# Patient Record
Sex: Male | Born: 1950 | Race: Black or African American | Hispanic: No | Marital: Married | Smoking: Former smoker
Health system: Southern US, Community
[De-identification: ages and names within clinical notes are randomized; demographics above are authoritative.]

## PROBLEM LIST (undated history)

## (undated) DIAGNOSIS — D649 Anemia, unspecified: Secondary | ICD-10-CM

## (undated) DIAGNOSIS — M1990 Unspecified osteoarthritis, unspecified site: Secondary | ICD-10-CM

## (undated) DIAGNOSIS — I10 Essential (primary) hypertension: Secondary | ICD-10-CM

## (undated) DIAGNOSIS — R7611 Nonspecific reaction to tuberculin skin test without active tuberculosis: Secondary | ICD-10-CM

## (undated) HISTORY — PX: CIRCUMCISION: SUR203

## (undated) HISTORY — PX: TONSILLECTOMY: SUR1361

---

## 2014-06-14 DIAGNOSIS — Z87891 Personal history of nicotine dependence: Secondary | ICD-10-CM | POA: Insufficient documentation

## 2014-06-14 DIAGNOSIS — R9431 Abnormal electrocardiogram [ECG] [EKG]: Secondary | ICD-10-CM | POA: Insufficient documentation

## 2014-06-14 DIAGNOSIS — I10 Essential (primary) hypertension: Secondary | ICD-10-CM | POA: Insufficient documentation

## 2014-06-14 DIAGNOSIS — R002 Palpitations: Secondary | ICD-10-CM | POA: Insufficient documentation

## 2015-11-21 DIAGNOSIS — D649 Anemia, unspecified: Secondary | ICD-10-CM | POA: Insufficient documentation

## 2017-02-19 DIAGNOSIS — I10 Essential (primary) hypertension: Secondary | ICD-10-CM | POA: Diagnosis not present

## 2017-02-19 DIAGNOSIS — Z5181 Encounter for therapeutic drug level monitoring: Secondary | ICD-10-CM | POA: Diagnosis not present

## 2017-02-19 DIAGNOSIS — D508 Other iron deficiency anemias: Secondary | ICD-10-CM | POA: Diagnosis not present

## 2017-02-19 DIAGNOSIS — Z13220 Encounter for screening for lipoid disorders: Secondary | ICD-10-CM | POA: Diagnosis not present

## 2017-02-19 DIAGNOSIS — I00 Rheumatic fever without heart involvement: Secondary | ICD-10-CM | POA: Diagnosis not present

## 2017-03-13 DIAGNOSIS — M1612 Unilateral primary osteoarthritis, left hip: Secondary | ICD-10-CM | POA: Diagnosis not present

## 2017-03-13 DIAGNOSIS — Z79899 Other long term (current) drug therapy: Secondary | ICD-10-CM | POA: Diagnosis not present

## 2017-03-13 DIAGNOSIS — M069 Rheumatoid arthritis, unspecified: Secondary | ICD-10-CM | POA: Diagnosis not present

## 2017-03-13 DIAGNOSIS — M19041 Primary osteoarthritis, right hand: Secondary | ICD-10-CM | POA: Diagnosis not present

## 2017-03-13 DIAGNOSIS — M160 Bilateral primary osteoarthritis of hip: Secondary | ICD-10-CM | POA: Diagnosis not present

## 2017-03-13 DIAGNOSIS — M19031 Primary osteoarthritis, right wrist: Secondary | ICD-10-CM | POA: Diagnosis not present

## 2017-03-13 DIAGNOSIS — M19032 Primary osteoarthritis, left wrist: Secondary | ICD-10-CM | POA: Diagnosis not present

## 2017-03-13 DIAGNOSIS — M19042 Primary osteoarthritis, left hand: Secondary | ICD-10-CM | POA: Diagnosis not present

## 2017-03-21 DIAGNOSIS — M069 Rheumatoid arthritis, unspecified: Secondary | ICD-10-CM | POA: Diagnosis not present

## 2017-03-21 DIAGNOSIS — Z79899 Other long term (current) drug therapy: Secondary | ICD-10-CM | POA: Diagnosis not present

## 2017-05-02 DIAGNOSIS — M160 Bilateral primary osteoarthritis of hip: Secondary | ICD-10-CM | POA: Diagnosis not present

## 2017-06-11 DIAGNOSIS — D508 Other iron deficiency anemias: Secondary | ICD-10-CM | POA: Diagnosis not present

## 2017-06-11 DIAGNOSIS — M069 Rheumatoid arthritis, unspecified: Secondary | ICD-10-CM | POA: Diagnosis not present

## 2017-06-11 DIAGNOSIS — Z5181 Encounter for therapeutic drug level monitoring: Secondary | ICD-10-CM | POA: Diagnosis not present

## 2017-06-11 DIAGNOSIS — Z79899 Other long term (current) drug therapy: Secondary | ICD-10-CM | POA: Diagnosis not present

## 2017-06-11 DIAGNOSIS — Z13220 Encounter for screening for lipoid disorders: Secondary | ICD-10-CM | POA: Diagnosis not present

## 2017-06-18 DIAGNOSIS — Z0000 Encounter for general adult medical examination without abnormal findings: Secondary | ICD-10-CM | POA: Diagnosis not present

## 2017-06-18 DIAGNOSIS — D508 Other iron deficiency anemias: Secondary | ICD-10-CM | POA: Diagnosis not present

## 2017-06-18 DIAGNOSIS — M069 Rheumatoid arthritis, unspecified: Secondary | ICD-10-CM | POA: Diagnosis not present

## 2017-10-16 DIAGNOSIS — D508 Other iron deficiency anemias: Secondary | ICD-10-CM | POA: Diagnosis not present

## 2017-10-23 DIAGNOSIS — M0520 Rheumatoid vasculitis with rheumatoid arthritis of unspecified site: Secondary | ICD-10-CM | POA: Diagnosis not present

## 2017-10-23 DIAGNOSIS — D508 Other iron deficiency anemias: Secondary | ICD-10-CM | POA: Diagnosis not present

## 2017-10-23 DIAGNOSIS — Z13220 Encounter for screening for lipoid disorders: Secondary | ICD-10-CM | POA: Diagnosis not present

## 2017-10-23 DIAGNOSIS — I10 Essential (primary) hypertension: Secondary | ICD-10-CM | POA: Diagnosis not present

## 2017-10-23 DIAGNOSIS — L309 Dermatitis, unspecified: Secondary | ICD-10-CM | POA: Diagnosis not present

## 2018-02-13 DIAGNOSIS — R69 Illness, unspecified: Secondary | ICD-10-CM | POA: Diagnosis not present

## 2018-02-17 DIAGNOSIS — Z136 Encounter for screening for cardiovascular disorders: Secondary | ICD-10-CM | POA: Diagnosis not present

## 2018-02-17 DIAGNOSIS — I10 Essential (primary) hypertension: Secondary | ICD-10-CM | POA: Diagnosis not present

## 2018-02-24 DIAGNOSIS — I10 Essential (primary) hypertension: Secondary | ICD-10-CM | POA: Diagnosis not present

## 2018-02-24 DIAGNOSIS — R0789 Other chest pain: Secondary | ICD-10-CM | POA: Diagnosis not present

## 2018-02-24 DIAGNOSIS — Z0001 Encounter for general adult medical examination with abnormal findings: Secondary | ICD-10-CM | POA: Diagnosis not present

## 2018-02-24 DIAGNOSIS — M069 Rheumatoid arthritis, unspecified: Secondary | ICD-10-CM | POA: Diagnosis not present

## 2018-03-31 DIAGNOSIS — M160 Bilateral primary osteoarthritis of hip: Secondary | ICD-10-CM | POA: Diagnosis not present

## 2018-03-31 DIAGNOSIS — M0579 Rheumatoid arthritis with rheumatoid factor of multiple sites without organ or systems involvement: Secondary | ICD-10-CM | POA: Diagnosis not present

## 2018-03-31 DIAGNOSIS — Z79899 Other long term (current) drug therapy: Secondary | ICD-10-CM | POA: Diagnosis not present

## 2018-06-29 DIAGNOSIS — M160 Bilateral primary osteoarthritis of hip: Secondary | ICD-10-CM | POA: Diagnosis not present

## 2018-06-29 DIAGNOSIS — Z79899 Other long term (current) drug therapy: Secondary | ICD-10-CM | POA: Diagnosis not present

## 2018-06-29 DIAGNOSIS — M0579 Rheumatoid arthritis with rheumatoid factor of multiple sites without organ or systems involvement: Secondary | ICD-10-CM | POA: Diagnosis not present

## 2018-06-29 DIAGNOSIS — M25572 Pain in left ankle and joints of left foot: Secondary | ICD-10-CM | POA: Diagnosis not present

## 2018-07-17 DIAGNOSIS — M160 Bilateral primary osteoarthritis of hip: Secondary | ICD-10-CM | POA: Diagnosis not present

## 2018-07-17 DIAGNOSIS — Z79899 Other long term (current) drug therapy: Secondary | ICD-10-CM | POA: Diagnosis not present

## 2018-07-17 DIAGNOSIS — M0579 Rheumatoid arthritis with rheumatoid factor of multiple sites without organ or systems involvement: Secondary | ICD-10-CM | POA: Diagnosis not present

## 2018-07-28 DIAGNOSIS — R7612 Nonspecific reaction to cell mediated immunity measurement of gamma interferon antigen response without active tuberculosis: Secondary | ICD-10-CM | POA: Diagnosis not present

## 2018-07-28 DIAGNOSIS — A150 Tuberculosis of lung: Secondary | ICD-10-CM | POA: Diagnosis not present

## 2018-07-31 DIAGNOSIS — R7612 Nonspecific reaction to cell mediated immunity measurement of gamma interferon antigen response without active tuberculosis: Secondary | ICD-10-CM | POA: Insufficient documentation

## 2018-08-11 DIAGNOSIS — H5203 Hypermetropia, bilateral: Secondary | ICD-10-CM | POA: Diagnosis not present

## 2018-08-18 DIAGNOSIS — I10 Essential (primary) hypertension: Secondary | ICD-10-CM | POA: Diagnosis not present

## 2018-08-25 DIAGNOSIS — I10 Essential (primary) hypertension: Secondary | ICD-10-CM | POA: Diagnosis not present

## 2018-08-25 DIAGNOSIS — R350 Frequency of micturition: Secondary | ICD-10-CM | POA: Insufficient documentation

## 2018-08-25 DIAGNOSIS — M0579 Rheumatoid arthritis with rheumatoid factor of multiple sites without organ or systems involvement: Secondary | ICD-10-CM | POA: Diagnosis not present

## 2018-08-25 DIAGNOSIS — D508 Other iron deficiency anemias: Secondary | ICD-10-CM | POA: Diagnosis not present

## 2018-08-25 DIAGNOSIS — Z125 Encounter for screening for malignant neoplasm of prostate: Secondary | ICD-10-CM | POA: Diagnosis not present

## 2018-08-25 DIAGNOSIS — Z0000 Encounter for general adult medical examination without abnormal findings: Secondary | ICD-10-CM | POA: Diagnosis not present

## 2018-08-25 DIAGNOSIS — N401 Enlarged prostate with lower urinary tract symptoms: Secondary | ICD-10-CM | POA: Diagnosis not present

## 2018-09-07 ENCOUNTER — Telehealth: Payer: Self-pay

## 2018-09-24 NOTE — Telephone Encounter (Signed)
TC with patient.  Explained to patient that there was a recall on Rifapentine and that [facility] cannot start that medication at this time.  Patient will need to start Rifampin if he still wants to proceed with LTBI tx. Per patient would like to start LTBI tx. Scheduled appt for 09/29/18. Aileen Fass, RN

## 2018-09-28 ENCOUNTER — Other Ambulatory Visit: Payer: Self-pay

## 2018-09-28 DIAGNOSIS — R7612 Nonspecific reaction to cell mediated immunity measurement of gamma interferon antigen response without active tuberculosis: Secondary | ICD-10-CM

## 2018-09-28 MED ORDER — RIFAMPIN 300 MG PO CAPS: 600.0000 mg | ORAL_CAPSULE | Freq: Every day | ORAL | 3 refills | Status: DC

## 2018-09-28 NOTE — Progress Notes (Signed)
Tuberculosis treatment orders  All patients are to be monitored per [place] and county TB policies.   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 Treat for latent TB per the following:  Rifampin 600mg  daily by mouth x 4 months, draw LFTs monthly per Dr. Ernestina Patches. Aileen Fass, RN

## 2018-09-29 ENCOUNTER — Ambulatory Visit (LOCAL_COMMUNITY_HEALTH_CENTER): Payer: Self-pay

## 2018-09-29 ENCOUNTER — Other Ambulatory Visit: Payer: Self-pay

## 2018-09-29 VITALS — Wt 197.0 lb

## 2018-09-29 DIAGNOSIS — R7612 Nonspecific reaction to cell mediated immunity measurement of gamma interferon antigen response without active tuberculosis: Secondary | ICD-10-CM

## 2018-09-29 MED ORDER — RIFAMPIN 300 MG PO CAPS: 600.0000 mg | ORAL_CAPSULE | Freq: Every day | ORAL | 0 refills | Status: AC

## 2018-09-29 NOTE — Progress Notes (Signed)
Rifampin 300mg  #60 dispensed per Dr. Ernestina Patches 09/28/18 order. Aileen Fass, RN

## 2018-09-30 LAB — CBC WITH DIFFERENTIAL/PLATELET
Basophils Absolute: 0.1 10*3/uL (ref 0.0–0.2)
Basos: 1 %
EOS (ABSOLUTE): 0.4 10*3/uL (ref 0.0–0.4)
Eos: 6 %
Hematocrit: 39.4 % (ref 37.5–51.0)
Hemoglobin: 12.4 g/dL — ABNORMAL LOW (ref 13.0–17.7)
Immature Grans (Abs): 0 10*3/uL (ref 0.0–0.1)
Immature Granulocytes: 0 %
Lymphocytes Absolute: 1.8 10*3/uL (ref 0.7–3.1)
Lymphs: 23 %
MCH: 26.2 pg — ABNORMAL LOW (ref 26.6–33.0)
MCHC: 31.5 g/dL (ref 31.5–35.7)
MCV: 83 fL (ref 79–97)
Monocytes Absolute: 0.8 10*3/uL (ref 0.1–0.9)
Monocytes: 10 %
Neutrophils Absolute: 4.6 10*3/uL (ref 1.4–7.0)
Neutrophils: 60 %
Platelets: 124 10*3/uL — ABNORMAL LOW (ref 150–450)
RBC: 4.74 x10E6/uL (ref 4.14–5.80)
RDW: 12.7 % (ref 11.6–15.4)
WBC: 7.6 10*3/uL (ref 3.4–10.8)

## 2018-09-30 LAB — HEPATIC FUNCTION PANEL
ALT: 13 IU/L (ref 0–44)
AST: 22 IU/L (ref 0–40)
Albumin: 4.4 g/dL (ref 3.8–4.8)
Alkaline Phosphatase: 61 IU/L (ref 39–117)
Bilirubin Total: 0.5 mg/dL (ref 0.0–1.2)
Bilirubin, Direct: 0.17 mg/dL (ref 0.00–0.40)
Total Protein: 6.9 g/dL (ref 6.0–8.5)

## 2018-10-27 ENCOUNTER — Other Ambulatory Visit: Payer: Self-pay

## 2018-10-27 ENCOUNTER — Ambulatory Visit (LOCAL_COMMUNITY_HEALTH_CENTER): Payer: Self-pay

## 2018-10-27 VITALS — Wt 198.0 lb

## 2018-10-27 DIAGNOSIS — R7612 Nonspecific reaction to cell mediated immunity measurement of gamma interferon antigen response without active tuberculosis: Secondary | ICD-10-CM

## 2018-10-27 MED ORDER — RIFAMPIN 300 MG PO CAPS: 600.0000 mg | ORAL_CAPSULE | Freq: Every day | ORAL | 0 refills | Status: AC

## 2018-10-27 NOTE — Progress Notes (Signed)
Pt states he has not started any new medicines since his last visit to [facility] and is only taking the Rifampin he gets at [facility]. Pt denies any problems or changes since starting Rifampin. Pt sent to lab for monthly LFTs. 2nd bottle of #60 Rifampin 300 mg dispensed to pt, 2 tablets po qd, per Dr Lauretta Chester order from 09/28/2018. Scheduled next TB med appt for 11/24/2018.

## 2018-10-28 LAB — HEPATIC FUNCTION PANEL
ALT: 8 IU/L (ref 0–44)
AST: 14 IU/L (ref 0–40)
Albumin: 4.1 g/dL (ref 3.8–4.8)
Alkaline Phosphatase: 66 IU/L (ref 39–117)
Bilirubin Total: 0.6 mg/dL (ref 0.0–1.2)
Bilirubin, Direct: 0.26 mg/dL (ref 0.00–0.40)
Total Protein: 6.6 g/dL (ref 6.0–8.5)

## 2018-10-30 NOTE — Progress Notes (Signed)
Curator for clinical support staff: I agree with the care provided to this patient and was available for any consultation.   Caren Macadam, MD, MPH, ABFM [facility] Medical Director

## 2018-11-02 NOTE — Progress Notes (Signed)
Curator for clinical support staff: I agree with dosing of this latent TB treatment and have reviewed the chart.    Caren Macadam, MD, MPH, ABFM [facility] Medical Director

## 2018-11-24 ENCOUNTER — Ambulatory Visit (LOCAL_COMMUNITY_HEALTH_CENTER): Payer: Self-pay

## 2018-11-24 ENCOUNTER — Other Ambulatory Visit: Payer: Self-pay

## 2018-11-24 VITALS — Wt 196.0 lb

## 2018-11-24 DIAGNOSIS — R7612 Nonspecific reaction to cell mediated immunity measurement of gamma interferon antigen response without active tuberculosis: Secondary | ICD-10-CM

## 2018-11-24 MED ORDER — RIFAMPIN 300 MG PO CAPS: 600.0000 mg | ORAL_CAPSULE | Freq: Every day | ORAL | 0 refills | Status: AC

## 2018-11-24 NOTE — Progress Notes (Signed)
Pt to clinic for 3rd bottle of Rifampin. Pt denies starting any new medications and denies problems and symptoms associated with Rifampin. Pt sent to lab for LFT. Dispensed #60 Rifampin 300 mg tablets, 2 tabs po qd, to pt per Dr. Joelene Millin [place]'s order dated 09/28/2018. Pt instructed to bring 3rd bottle with remaining Rifampin with him to his appt on 12/22/2018.

## 2018-11-25 LAB — HEPATIC FUNCTION PANEL
ALT: 5 IU/L (ref 0–44)
AST: 13 IU/L (ref 0–40)
Albumin: 4.2 g/dL (ref 3.8–4.8)
Alkaline Phosphatase: 74 IU/L (ref 39–117)
Bilirubin Total: 0.6 mg/dL (ref 0.0–1.2)
Bilirubin, Direct: 0.27 mg/dL (ref 0.00–0.40)
Total Protein: 7 g/dL (ref 6.0–8.5)

## 2018-12-22 ENCOUNTER — Ambulatory Visit (LOCAL_COMMUNITY_HEALTH_CENTER): Payer: Self-pay

## 2018-12-22 ENCOUNTER — Other Ambulatory Visit: Payer: Self-pay

## 2018-12-22 VITALS — Wt 195.0 lb

## 2018-12-22 DIAGNOSIS — R7612 Nonspecific reaction to cell mediated immunity measurement of gamma interferon antigen response without active tuberculosis: Secondary | ICD-10-CM

## 2018-12-22 MED ORDER — RIFAMPIN 300 MG PO CAPS: 600.0000 mg | ORAL_CAPSULE | Freq: Every day | ORAL | 0 refills | Status: AC

## 2018-12-22 NOTE — Progress Notes (Signed)
Pt to clinic for 4th bottle of Rifampin. Pt has 6 days Alipio of Rifampin left in 3rd bottle and has already taken his TB medication today. Pt denies starting any new medications and denies problems and symptoms associated with Rifampin. Pt sent to lab for LFT. Dispensed #60 Rifampin 300 mg tablets, 2 tabs po qd, to pt per Dr. Joelene Millin [place]'s order dated 09/28/2018. Completion letter and card reviewed with and provided to pt. ROI signed for [facility] to share TB documents with PCP. TB medication completion document faxed to [facility].

## 2018-12-23 LAB — HEPATIC FUNCTION PANEL
ALT: 5 IU/L (ref 0–44)
AST: 15 IU/L (ref 0–40)
Albumin: 4.1 g/dL (ref 3.8–4.8)
Alkaline Phosphatase: 73 IU/L (ref 39–117)
Bilirubin Total: 0.7 mg/dL (ref 0.0–1.2)
Bilirubin, Direct: 0.33 mg/dL (ref 0.00–0.40)
Total Protein: 6.8 g/dL (ref 6.0–8.5)

## 2019-01-04 ENCOUNTER — Other Ambulatory Visit: Payer: Self-pay

## 2019-01-04 DIAGNOSIS — Z20822 Contact with and (suspected) exposure to covid-19: Secondary | ICD-10-CM

## 2019-01-06 LAB — NOVEL CORONAVIRUS, NAA: SARS-CoV-2, NAA: NOT DETECTED

## 2019-03-10 DIAGNOSIS — I10 Essential (primary) hypertension: Secondary | ICD-10-CM | POA: Diagnosis not present

## 2019-03-10 DIAGNOSIS — Z13220 Encounter for screening for lipoid disorders: Secondary | ICD-10-CM | POA: Diagnosis not present

## 2019-03-10 DIAGNOSIS — Z125 Encounter for screening for malignant neoplasm of prostate: Secondary | ICD-10-CM | POA: Diagnosis not present

## 2019-03-17 DIAGNOSIS — Z23 Encounter for immunization: Secondary | ICD-10-CM | POA: Diagnosis not present

## 2019-03-17 DIAGNOSIS — N401 Enlarged prostate with lower urinary tract symptoms: Secondary | ICD-10-CM | POA: Diagnosis not present

## 2019-03-17 DIAGNOSIS — R350 Frequency of micturition: Secondary | ICD-10-CM | POA: Diagnosis not present

## 2019-03-17 DIAGNOSIS — K219 Gastro-esophageal reflux disease without esophagitis: Secondary | ICD-10-CM | POA: Diagnosis not present

## 2019-03-17 DIAGNOSIS — R079 Chest pain, unspecified: Secondary | ICD-10-CM | POA: Diagnosis not present

## 2019-03-17 DIAGNOSIS — Z0000 Encounter for general adult medical examination without abnormal findings: Secondary | ICD-10-CM | POA: Diagnosis not present

## 2019-03-17 DIAGNOSIS — M0579 Rheumatoid arthritis with rheumatoid factor of multiple sites without organ or systems involvement: Secondary | ICD-10-CM | POA: Diagnosis not present

## 2019-03-17 DIAGNOSIS — R0789 Other chest pain: Secondary | ICD-10-CM | POA: Diagnosis not present

## 2019-03-17 DIAGNOSIS — Z7983 Long term (current) use of bisphosphonates: Secondary | ICD-10-CM | POA: Diagnosis not present

## 2019-06-15 DIAGNOSIS — M47812 Spondylosis without myelopathy or radiculopathy, cervical region: Secondary | ICD-10-CM | POA: Diagnosis not present

## 2019-06-15 DIAGNOSIS — M160 Bilateral primary osteoarthritis of hip: Secondary | ICD-10-CM | POA: Diagnosis not present

## 2019-06-15 DIAGNOSIS — M542 Cervicalgia: Secondary | ICD-10-CM | POA: Diagnosis not present

## 2019-06-15 DIAGNOSIS — Z227 Latent tuberculosis: Secondary | ICD-10-CM | POA: Diagnosis not present

## 2019-06-15 DIAGNOSIS — R937 Abnormal findings on diagnostic imaging of other parts of musculoskeletal system: Secondary | ICD-10-CM | POA: Diagnosis not present

## 2019-06-15 DIAGNOSIS — M0579 Rheumatoid arthritis with rheumatoid factor of multiple sites without organ or systems involvement: Secondary | ICD-10-CM | POA: Diagnosis not present

## 2019-06-15 DIAGNOSIS — Z79899 Other long term (current) drug therapy: Secondary | ICD-10-CM | POA: Diagnosis not present

## 2019-06-16 ENCOUNTER — Other Ambulatory Visit: Payer: Self-pay | Admitting: Internal Medicine

## 2019-06-16 DIAGNOSIS — M4302 Spondylolysis, cervical region: Secondary | ICD-10-CM

## 2019-06-24 DIAGNOSIS — M25552 Pain in left hip: Secondary | ICD-10-CM | POA: Diagnosis not present

## 2019-06-24 DIAGNOSIS — M1612 Unilateral primary osteoarthritis, left hip: Secondary | ICD-10-CM | POA: Diagnosis not present

## 2019-06-30 ENCOUNTER — Ambulatory Visit
Admission: RE | Admit: 2019-06-30 | Discharge: 2019-06-30 | Disposition: A | Discharge status: Home / Self Care | Payer: Medicare HMO | Source: Ambulatory Visit | Attending: Internal Medicine | Admitting: Internal Medicine

## 2019-06-30 ENCOUNTER — Other Ambulatory Visit: Payer: Self-pay

## 2019-06-30 DIAGNOSIS — M4302 Spondylolysis, cervical region: Secondary | ICD-10-CM | POA: Diagnosis not present

## 2019-06-30 DIAGNOSIS — M4802 Spinal stenosis, cervical region: Secondary | ICD-10-CM | POA: Diagnosis not present

## 2019-06-30 DIAGNOSIS — M542 Cervicalgia: Secondary | ICD-10-CM | POA: Diagnosis not present

## 2019-07-13 DIAGNOSIS — Z79899 Other long term (current) drug therapy: Secondary | ICD-10-CM | POA: Diagnosis not present

## 2019-07-13 DIAGNOSIS — M160 Bilateral primary osteoarthritis of hip: Secondary | ICD-10-CM | POA: Diagnosis not present

## 2019-07-13 DIAGNOSIS — M0579 Rheumatoid arthritis with rheumatoid factor of multiple sites without organ or systems involvement: Secondary | ICD-10-CM | POA: Diagnosis not present

## 2019-07-13 DIAGNOSIS — Z227 Latent tuberculosis: Secondary | ICD-10-CM | POA: Diagnosis not present

## 2019-07-16 DIAGNOSIS — M5030 Other cervical disc degeneration, unspecified cervical region: Secondary | ICD-10-CM | POA: Diagnosis not present

## 2019-07-16 DIAGNOSIS — M4802 Spinal stenosis, cervical region: Secondary | ICD-10-CM | POA: Diagnosis not present

## 2019-07-16 DIAGNOSIS — M47812 Spondylosis without myelopathy or radiculopathy, cervical region: Secondary | ICD-10-CM | POA: Diagnosis not present

## 2019-08-11 DIAGNOSIS — H5203 Hypermetropia, bilateral: Secondary | ICD-10-CM | POA: Diagnosis not present

## 2019-08-31 DIAGNOSIS — M1612 Unilateral primary osteoarthritis, left hip: Secondary | ICD-10-CM | POA: Diagnosis not present

## 2019-09-04 DIAGNOSIS — M1612 Unilateral primary osteoarthritis, left hip: Secondary | ICD-10-CM | POA: Insufficient documentation

## 2019-10-19 DIAGNOSIS — Z79899 Other long term (current) drug therapy: Secondary | ICD-10-CM | POA: Diagnosis not present

## 2019-10-19 DIAGNOSIS — M0579 Rheumatoid arthritis with rheumatoid factor of multiple sites without organ or systems involvement: Secondary | ICD-10-CM | POA: Diagnosis not present

## 2019-11-01 ENCOUNTER — Encounter
Admission: RE | Admit: 2019-11-01 | Discharge: 2019-11-01 | Disposition: A | Discharge status: Home / Self Care | Payer: Medicare HMO | Source: Ambulatory Visit | Attending: Orthopedic Surgery | Admitting: Orthopedic Surgery

## 2019-11-01 ENCOUNTER — Other Ambulatory Visit: Payer: Self-pay

## 2019-11-01 DIAGNOSIS — Z01818 Encounter for other preprocedural examination: Secondary | ICD-10-CM | POA: Insufficient documentation

## 2019-11-01 HISTORY — DX: Nonspecific reaction to tuberculin skin test without active tuberculosis: R76.11

## 2019-11-01 HISTORY — DX: Unspecified osteoarthritis, unspecified site: M19.90

## 2019-11-01 HISTORY — DX: Anemia, unspecified: D64.9

## 2019-11-01 HISTORY — DX: Essential (primary) hypertension: I10

## 2019-11-01 LAB — APTT: aPTT: 28 seconds (ref 24–36)

## 2019-11-01 LAB — PROTIME-INR
INR: 1.1 (ref 0.8–1.2)
Prothrombin Time: 13.8 seconds (ref 11.4–15.2)

## 2019-11-01 LAB — URINALYSIS, ROUTINE W REFLEX MICROSCOPIC
Bilirubin Urine: NEGATIVE
Glucose, UA: NEGATIVE mg/dL
Hgb urine dipstick: NEGATIVE
Ketones, ur: NEGATIVE mg/dL
Leukocytes,Ua: NEGATIVE
Nitrite: NEGATIVE
Protein, ur: NEGATIVE mg/dL
Specific Gravity, Urine: 1.018 (ref 1.005–1.030)
pH: 5 (ref 5.0–8.0)

## 2019-11-01 LAB — SURGICAL PCR SCREEN
MRSA, PCR: NEGATIVE
Staphylococcus aureus: NEGATIVE

## 2019-11-01 LAB — SEDIMENTATION RATE: Sed Rate: 58 mm/hr — ABNORMAL HIGH (ref 0–20)

## 2019-11-01 LAB — COMPREHENSIVE METABOLIC PANEL
ALT: 9 U/L (ref 0–44)
AST: 16 U/L (ref 15–41)
Albumin: 4 g/dL (ref 3.5–5.0)
Alkaline Phosphatase: 48 U/L (ref 38–126)
Anion gap: 9 (ref 5–15)
BUN: 17 mg/dL (ref 8–23)
CO2: 28 mmol/L (ref 22–32)
Calcium: 8.8 mg/dL — ABNORMAL LOW (ref 8.9–10.3)
Chloride: 102 mmol/L (ref 98–111)
Creatinine, Ser: 1.1 mg/dL (ref 0.61–1.24)
GFR calc Af Amer: >60 mL/min (ref >60)
GFR calc non Af Amer: >60 mL/min (ref >60)
Glucose, Bld: 86 mg/dL (ref 70–99)
Potassium: 3.5 mmol/L (ref 3.5–5.1)
Sodium: 139 mmol/L (ref 135–145)
Total Bilirubin: 0.9 mg/dL (ref 0.3–1.2)
Total Protein: 7.7 g/dL (ref 6.5–8.1)

## 2019-11-01 LAB — C-REACTIVE PROTEIN: CRP: 2.3 mg/dL — ABNORMAL HIGH (ref <1.0)

## 2019-11-01 LAB — TYPE AND SCREEN
ABO/RH(D): B POS
Antibody Screen: NEGATIVE

## 2019-11-01 NOTE — Patient Instructions (Signed)
Your procedure is scheduled on:Mon. 9/27 Report to Day Surgery. To find out your arrival time please call [phone] between 1PM - 3PM on Friday 9/24.  Remember: Instructions that are not followed completely may result in serious medical risk,  up to and including death, or upon the discretion of your surgeon and anesthesiologist your  surgery may need to be rescheduled.     _X__ 1. Do not eat food after midnight the night before your procedure.                 No chewing gum or hard candies. You may drink clear liquids up to 2 hours                 before you are scheduled to arrive for your surgery- DO not drink clear                 liquids within 2 hours of the start of your surgery.                 Clear Liquids include:  water, apple juice without pulp, clear Gatorade, G2 or                  Gatorade Zero (avoid Red/Purple/Blue), Black Coffee or Tea (Do not add                 anything to coffee or tea). __x___2.   Complete the "Ensure Clear Pre-surgery Clear Carbohydrate Drink" provided to you, 2 hours before arrival.   __X__2.  On the morning of surgery brush your teeth with toothpaste and water, you                may rinse your mouth with mouthwash if you wish.  Do not swallow any toothpaste of mouthwash.     _X__ 3.  No Alcohol for 24 hours before or after surgery.   ___ 4.  Do Not Smoke or use e-cigarettes For 24 Hours Prior to Your Surgery.                 Do not use any chewable tobacco products for at least 6 hours prior to                 Surgery.  ___  5.  Do not use any recreational drugs (marijuana, cocaine, heroin, ecstasy, MDMA or other)                For at least one week prior to your surgery.  Combination of these drugs with anesthesia                May have life threatening results.  ____  6.  Bring all medications with you on the day of surgery if instructed.   __x__  7.  Notify your doctor if there is any change in your medical  condition      (cold, fever, infections).     Do not wear jewelry, Do not wear lotions,  You may wear deodorant. Do not shave 48 hours prior to surgery. Men may shave face and neck. Do not bring valuables to the hospital.    [facility] is not responsible for any belongings or valuables.  Contacts, dentures or bridgework may not be worn into surgery. Leave your suitcase in the car. After surgery it may be brought to your room. For patients admitted to the hospital, discharge time is determined by your treatment team.   Patients  discharged the day of surgery will not be allowed to drive home.   Make arrangements for someone to be with you for the first 24 hours of your Same Day Discharge.    Please read over the following fact sheets that you were given:   Incentive Spirometer   _x___ Take these medicines the morning of surgery with A SIP OF WATER:    1. none  2.   3.   4.  5.  6.  ____ Fleet Enema (as directed)   __x__ Use CHG Soap (or wipes) as directed  ____ Use Benzoyl Peroxide Gel as instructed  ____ Use inhalers on the day of surgery  ____ Stop metformin 2 days prior to surgery    ____ Take 1/2 of usual insulin dose the night before surgery. No insulin the morning          of surgery.   __x__ Stop aspirin today  __x__ Stop Anti-inflammatories today   Ibuprofen aleve or aspirin products   May take tylenol.  (Stop Arava as per Rheumatologist instructions)   __x__ Stop supplements until after surgery . Ascorbic Acid (VITAMIN C) 1000 MG tablet   ____ Bring C-Pap to the hospital.    If you have any questions regarding your pre-procedure instructions,  Please call Pre-admit Testing at [phone] [facility]

## 2019-11-02 LAB — URINE CULTURE
Culture: NO GROWTH
Special Requests: NORMAL

## 2019-11-04 ENCOUNTER — Other Ambulatory Visit: Payer: Self-pay

## 2019-11-04 ENCOUNTER — Other Ambulatory Visit
Admission: RE | Admit: 2019-11-04 | Discharge: 2019-11-04 | Disposition: A | Discharge status: Home / Self Care | Payer: Medicare HMO | Source: Ambulatory Visit | Attending: Orthopedic Surgery | Admitting: Orthopedic Surgery

## 2019-11-04 DIAGNOSIS — Z20822 Contact with and (suspected) exposure to covid-19: Secondary | ICD-10-CM | POA: Diagnosis not present

## 2019-11-04 DIAGNOSIS — Z01812 Encounter for preprocedural laboratory examination: Secondary | ICD-10-CM | POA: Insufficient documentation

## 2019-11-04 LAB — IGE: IgE (Immunoglobulin E), Serum: 413 IU/mL (ref 6–495)

## 2019-11-04 LAB — SARS CORONAVIRUS 2 (TAT 6-24 HRS): SARS Coronavirus 2: NEGATIVE

## 2019-11-07 ENCOUNTER — Encounter: Payer: Self-pay | Admitting: Orthopedic Surgery

## 2019-11-07 NOTE — H&P (Signed)
ORTHOPAEDIC HISTORY & PHYSICAL Progress Notes  Michelene Gardener, [place] - 11/02/2019 11:30 AM EDT [facility] Chief Complaint:   Chief Complaint  Patient presents with  . Hip Pain  H & P LEFT HIP   History of Present Illness:   Erik Peck is a 69 y.o. male that presents to clinic today for his preoperative history and evaluation. Patient presents unaccompanied. The patient is scheduled to undergo a left total hip arthroplasty on 11/08/19 by Dr. Ernest Pine. His pain began around 3 years ago. The pain is located in he left hip and groin. He describes his pain as worse with weightbearing and movement of the hip.He denies associated numbness or tingling.   The patient's symptoms have progressed to the point that they decrease his quality of life. The patient has previously undergone conservative treatment including NSAIDS, intra-articular cortisone hip injections and activity modification without adequate control of his symptoms.  Patient denies significant cardiac history, denies history of blood clots, denies previous lumbar surgery.  Patient does report a penicillin allergy. Patient has a history of rheumatoid arthritis and is currently treated with prednisone and leflunomide.  Past Medical, Surgical, Family, Social History, Allergies, Medications:   Past Medical History:  Past Medical History:  Diagnosis Date  . Hypertension  . Rheumatoid arthritis(714.0) (CMS-HCC)   Past Surgical History: History reviewed. No pertinent surgical history.  Current Medications:  Current Outpatient Medications  Medication Sig Dispense Refill  . acetaminophen (TYLENOL) 650 MG ER tablet Take 1,300 mg by mouth every 8 (eight) hours as needed for Pain  . ascorbic acid, vitamin C, (VITAMIN C) 1000 MG tablet Take 500 mg by mouth once daily  . aspirin 81 MG EC tablet Take 81 mg by mouth once daily  . ergocalciferol, vitamin D2, 1,250 mcg (50,000 unit) capsule  TAKE 1 CAPSULE EVERY 14 DAYS 6 capsule 0  . FERATE 240 mg (27 mg iron) tablet Take 27 mg by mouth 3 (three) times a day 0  . hydrocortisone 1 % cream Apply topically 2 (two) times daily  . leflunomide (ARAVA) 20 MG tablet 1 tab QD, 90 days 90 tablet 4  . lisinopriL-hydrochlorothiazide (ZESTORETIC) 20-12.5 mg tablet TAKE 1 TABLET EVERY DAY 90 tablet 1   No current facility-administered medications for this visit.   Allergies:  Allergies  Allergen Reactions  . Penicillins Swelling and Rash      Social History:  Social History   Socioeconomic History  . Marital status: Married  Spouse name: Talbert Forest  . Number of children: 0  . Years of education: 10  . Highest education level: Not on file  Occupational History  . 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 Smoking status: Never Smoker  . Smokeless tobacco: Never Used  Vaping Use  . Vaping Use: Never used  Substance and Sexual Activity  . Alcohol use: No  . Drug use: No  . Sexual activity: Not on file  Other Topics Concern  . Not on file  Social History Narrative  . Not on file   Social Determinants of Health   Financial Resource Strain:  . Difficulty of Paying Living Expenses:  Food Insecurity:  . Worried About Programme researcher, broadcasting/film/video in the Last Year:  . Barista in the Last Year:  Transportation Needs:  . Freight forwarder (Medical):  Marland Kitchen Lack of Transportation (Non-Medical):  Physical Activity:  . Days of Exercise per Week:  . Minutes of Exercise per  Session:  Stress:  . Feeling of Stress :  Social Connections:  . Frequency of Communication with Friends and Family:  . Frequency of Social Gatherings with Friends and Family:  . Attends Religious Services:  . Active Member of Clubs or Organizations:  . Attends Banker Meetings:  Marland Kitchen Marital Status:   Family History: History reviewed. No pertinent family history.  Review of Systems:   A 10+ ROS was performed, reviewed, and the  pertinent orthopaedic findings are documented in the HPI.   Physical Examination:   BP 140/80  Ht 175.3 cm (5\' 9" )  Wt 90.3 kg (199 lb)  BMI 29.39 kg/m   Patient is a well-developed, well-nourished male in no acute distress. Patient has normal mood and affect. Patient is alert and oriented to person, place, and time.   HEENT: Atraumatic, normocephalic. Pupils equal and reactive to light. Extraocular motion intact. Noninjected sclera.  Cardiovascular: Regular rate and rhythm, with no murmurs, rubs, or gallops. Distal pulses palpable.  Respiratory: Lungs clear to auscultation bilaterally.    Left Hip: Pelvic tilt: Negative Limb lengths: Equal with the patient standing Soft tissue swelling: Negative Erythema: Negative Crepitance: Negative Tenderness: Greater trochanter is nontender to palpation. Moderate pain is elicited by axial compression or extremes of rotation. Atrophy: No atrophy. Good hip flexor and abductor strength. Range of Motion: EXT/FLEX: 0/0/100 ADD/ABD: 20/0/20 IR/ER: 10/0/10  Sensation is intact over the saphenous, lateral cutaneous, superficial fibular, and deep fibular nerve distributions.  Tests Performed/Reviewed:  X-rays  No new radiographs were obtained today. Previous radiographs were reviewed of the left hip and revealed complete loss of femoral acetabular joint space with bone-on-bone contact. Cystic changes of the femoral head noted. Osteophyte formation of the femoral head noted. No fractures or dislocations.  Impression:   ICD-10-CM  1. Primary osteoarthritis of left hip M16.12   Plan:   The patient has end-stage degenerative changes of the left hip. It was explained to the patient that the condition is progressive in nature. Having failed conservative treatment, the patient has elected to proceed with a total joint arthroplasty. The patient will undergo a total joint arthroplasty with Dr. 11-29-2004. The risks of surgery, including blood clot and  infection, were discussed with the patient. Measures to reduce these risks, including the use of anticoagulation, perioperative antibiotics, and early ambulation were discussed. The importance of postoperative physical therapy was discussed with the patient. The patient elects to proceed with surgery. The patient is instructed to stop all blood thinners prior to surgery. The patient is instructed to call the hospital the day before surgery to learn of the proper arrival time.   Contact our office with any questions or concerns. Follow up as indicated, or sooner should any new problems arise, if conditions worsen, or if they are otherwise concerned.   Ernest Pine, PA-C [facility]opa[facility] Medicine [address] [place], [place] Phone: [phone]  This note was generated in part with voice recognition software and I apologize for any typographical errors that were not detected and corrected.   Electronically signed by [phone], PA at 11/02/2019 6:32 PM EDT

## 2019-11-08 ENCOUNTER — Other Ambulatory Visit: Payer: Self-pay

## 2019-11-08 ENCOUNTER — Ambulatory Visit: Payer: Medicare HMO

## 2019-11-08 ENCOUNTER — Ambulatory Visit: Payer: Medicare HMO | Admitting: Urgent Care

## 2019-11-08 ENCOUNTER — Encounter: Payer: Self-pay | Admitting: Orthopedic Surgery

## 2019-11-08 ENCOUNTER — Encounter
Admission: RE | Disposition: A | Discharge status: Home / Self Care | Payer: Self-pay | Source: Home / Self Care | Attending: Orthopedic Surgery

## 2019-11-08 ENCOUNTER — Ambulatory Visit
Admission: RE | Admit: 2019-11-08 | Discharge: 2019-11-08 | Disposition: A | Discharge status: Home / Self Care | Payer: Medicare HMO | Attending: Orthopedic Surgery | Admitting: Orthopedic Surgery

## 2019-11-08 DIAGNOSIS — Z87891 Personal history of nicotine dependence: Secondary | ICD-10-CM | POA: Diagnosis not present

## 2019-11-08 DIAGNOSIS — Z7982 Long term (current) use of aspirin: Secondary | ICD-10-CM | POA: Diagnosis not present

## 2019-11-08 DIAGNOSIS — M069 Rheumatoid arthritis, unspecified: Secondary | ICD-10-CM | POA: Insufficient documentation

## 2019-11-08 DIAGNOSIS — Z96649 Presence of unspecified artificial hip joint: Secondary | ICD-10-CM

## 2019-11-08 DIAGNOSIS — Z791 Long term (current) use of non-steroidal anti-inflammatories (NSAID): Secondary | ICD-10-CM | POA: Insufficient documentation

## 2019-11-08 DIAGNOSIS — M0689 Other specified rheumatoid arthritis, multiple sites: Secondary | ICD-10-CM | POA: Diagnosis not present

## 2019-11-08 DIAGNOSIS — M1612 Unilateral primary osteoarthritis, left hip: Secondary | ICD-10-CM | POA: Diagnosis not present

## 2019-11-08 DIAGNOSIS — Z471 Aftercare following joint replacement surgery: Secondary | ICD-10-CM | POA: Diagnosis not present

## 2019-11-08 DIAGNOSIS — I10 Essential (primary) hypertension: Secondary | ICD-10-CM | POA: Insufficient documentation

## 2019-11-08 DIAGNOSIS — Z79899 Other long term (current) drug therapy: Secondary | ICD-10-CM | POA: Insufficient documentation

## 2019-11-08 DIAGNOSIS — Z96642 Presence of left artificial hip joint: Secondary | ICD-10-CM

## 2019-11-08 DIAGNOSIS — D509 Iron deficiency anemia, unspecified: Secondary | ICD-10-CM | POA: Diagnosis not present

## 2019-11-08 HISTORY — PX: TOTAL HIP ARTHROPLASTY: SHX124

## 2019-11-08 LAB — ABO/RH: ABO/RH(D): B POS

## 2019-11-08 SURGERY — ARTHROPLASTY, HIP, TOTAL,POSTERIOR APPROACH
Anesthesia: Spinal | Site: Hip | Laterality: Left

## 2019-11-08 MED ORDER — CELECOXIB 200 MG PO CAPS
ORAL_CAPSULE | ORAL | Status: AC
  Administered 2019-11-08: 400 mg via ORAL
  Filled 2019-11-08: qty 2

## 2019-11-08 MED ORDER — TRANEXAMIC ACID-NACL 1000-0.7 MG/100ML-% IV SOLN
INTRAVENOUS | Status: AC
  Administered 2019-11-08: 1000 mg via INTRAVENOUS
  Filled 2019-11-08: qty 100

## 2019-11-08 MED ORDER — GABAPENTIN 300 MG PO CAPS
ORAL_CAPSULE | ORAL | Status: AC
  Administered 2019-11-08: 300 mg via ORAL
  Filled 2019-11-08: qty 1

## 2019-11-08 MED ORDER — PROPOFOL 10 MG/ML IV BOLUS
INTRAVENOUS | Status: DC | PRN
  Administered 2019-11-08: 20 mg via INTRAVENOUS

## 2019-11-08 MED ORDER — SURGIRINSE WOUND IRRIGATION SYSTEM - OPTIME
TOPICAL | Status: DC | PRN
  Administered 2019-11-08: 900 mL via TOPICAL

## 2019-11-08 MED ORDER — OXYCODONE HCL 5 MG PO TABS: 5.0000 mg | ORAL_TABLET | Freq: Once | ORAL | Status: DC | PRN

## 2019-11-08 MED ORDER — BUPIVACAINE HCL (PF) 0.5 % IJ SOLN
INTRAMUSCULAR | Status: DC | PRN
  Administered 2019-11-08: 3 mL via INTRATHECAL

## 2019-11-08 MED ORDER — ENOXAPARIN SODIUM 40 MG/0.4ML ~~LOC~~ SOLN: 40.0000 mg | SUBCUTANEOUS | Status: DC

## 2019-11-08 MED ORDER — OXYCODONE HCL 5 MG PO TABS: 5.0000 mg | ORAL_TABLET | ORAL | Status: DC | PRN

## 2019-11-08 MED ORDER — PANTOPRAZOLE SODIUM 40 MG PO TBEC
40.0000 mg | DELAYED_RELEASE_TABLET | Freq: Two times a day (BID) | ORAL | Status: DC
  Filled 2019-11-08: qty 1

## 2019-11-08 MED ORDER — CHLORHEXIDINE GLUCONATE 4 % EX LIQD
60.0000 mL | Freq: Once | CUTANEOUS | Status: AC
  Administered 2019-11-08: 4 via TOPICAL

## 2019-11-08 MED ORDER — MENTHOL 3 MG MT LOZG
1.0000 | LOZENGE | OROMUCOSAL | Status: DC | PRN
  Filled 2019-11-08: qty 9

## 2019-11-08 MED ORDER — CEFAZOLIN SODIUM-DEXTROSE 2-4 GM/100ML-% IV SOLN
2.0000 g | Freq: Four times a day (QID) | INTRAVENOUS | Status: AC
  Administered 2019-11-08: 2 g via INTRAVENOUS

## 2019-11-08 MED ORDER — MIDAZOLAM HCL 2 MG/2ML IJ SOLN
INTRAMUSCULAR | Status: AC
  Filled 2019-11-08: qty 2

## 2019-11-08 MED ORDER — BISACODYL 10 MG RE SUPP
10.0000 mg | Freq: Every day | RECTAL | Status: DC | PRN
  Filled 2019-11-08: qty 1

## 2019-11-08 MED ORDER — SENNOSIDES-DOCUSATE SODIUM 8.6-50 MG PO TABS
1.0000 | ORAL_TABLET | Freq: Two times a day (BID) | ORAL | Status: DC
  Filled 2019-11-08: qty 1

## 2019-11-08 MED ORDER — FENTANYL CITRATE (PF) 100 MCG/2ML IJ SOLN: 25.0000 ug | INTRAMUSCULAR | Status: DC | PRN

## 2019-11-08 MED ORDER — BUPIVACAINE HCL (PF) 0.5 % IJ SOLN
INTRAMUSCULAR | Status: AC
  Filled 2019-11-08: qty 10

## 2019-11-08 MED ORDER — ACETAMINOPHEN 10 MG/ML IV SOLN
INTRAVENOUS | Status: AC
  Filled 2019-11-08: qty 100

## 2019-11-08 MED ORDER — ACETAMINOPHEN 10 MG/ML IV SOLN: 1000.0000 mg | Freq: Four times a day (QID) | INTRAVENOUS | Status: DC

## 2019-11-08 MED ORDER — GABAPENTIN 300 MG PO CAPS: 300.0000 mg | ORAL_CAPSULE | Freq: Once | ORAL | Status: AC

## 2019-11-08 MED ORDER — TRANEXAMIC ACID-NACL 1000-0.7 MG/100ML-% IV SOLN: 1000.0000 mg | Freq: Once | INTRAVENOUS | Status: AC

## 2019-11-08 MED ORDER — LACTATED RINGERS IV BOLUS
250.0000 mL | Freq: Once | INTRAVENOUS | Status: AC
  Administered 2019-11-08: 250 mL via INTRAVENOUS

## 2019-11-08 MED ORDER — TRAMADOL HCL 50 MG PO TABS: 50.0000 mg | ORAL_TABLET | ORAL | Status: DC | PRN

## 2019-11-08 MED ORDER — CEFAZOLIN SODIUM-DEXTROSE 2-4 GM/100ML-% IV SOLN
2.0000 g | INTRAVENOUS | Status: AC
  Administered 2019-11-08: 2 g via INTRAVENOUS

## 2019-11-08 MED ORDER — ENSURE PRE-SURGERY PO LIQD
296.0000 mL | Freq: Once | ORAL | Status: AC
  Administered 2019-11-08: 296 mL via ORAL
  Filled 2019-11-08: qty 296

## 2019-11-08 MED ORDER — FLEET ENEMA 7-19 GM/118ML RE ENEM: 1.0000 | ENEMA | Freq: Once | RECTAL | Status: DC | PRN

## 2019-11-08 MED ORDER — HYDROMORPHONE HCL 1 MG/ML IJ SOLN: 0.5000 mg | INTRAMUSCULAR | Status: DC | PRN

## 2019-11-08 MED ORDER — PHENOL 1.4 % MT LIQD
1.0000 | OROMUCOSAL | Status: DC | PRN
  Filled 2019-11-08: qty 177

## 2019-11-08 MED ORDER — ACETAMINOPHEN 10 MG/ML IV SOLN
INTRAVENOUS | Status: DC | PRN
  Administered 2019-11-08: 1000 mg via INTRAVENOUS

## 2019-11-08 MED ORDER — CEFAZOLIN SODIUM-DEXTROSE 2-4 GM/100ML-% IV SOLN
INTRAVENOUS | Status: AC
  Filled 2019-11-08: qty 100

## 2019-11-08 MED ORDER — PHENYLEPHRINE HCL (PRESSORS) 10 MG/ML IV SOLN
INTRAVENOUS | Status: AC
  Filled 2019-11-08: qty 1

## 2019-11-08 MED ORDER — PROPOFOL 500 MG/50ML IV EMUL
INTRAVENOUS | Status: AC
  Filled 2019-11-08: qty 50

## 2019-11-08 MED ORDER — MIDAZOLAM HCL 5 MG/5ML IJ SOLN
INTRAMUSCULAR | Status: DC | PRN
  Administered 2019-11-08: 2 mg via INTRAVENOUS

## 2019-11-08 MED ORDER — PROPOFOL 500 MG/50ML IV EMUL
INTRAVENOUS | Status: DC | PRN
  Administered 2019-11-08: 35 ug/kg/min via INTRAVENOUS

## 2019-11-08 MED ORDER — ONDANSETRON HCL 4 MG/2ML IJ SOLN
4.0000 mg | Freq: Four times a day (QID) | INTRAMUSCULAR | Status: DC | PRN
Start: 2019-11-08 — End: 2019-11-08

## 2019-11-08 MED ORDER — LACTATED RINGERS IV BOLUS
500.0000 mL | Freq: Once | INTRAVENOUS | Status: AC
Start: 2019-11-08 — End: 2019-11-08
  Administered 2019-11-08: 500 mL via INTRAVENOUS

## 2019-11-08 MED ORDER — TRANEXAMIC ACID-NACL 1000-0.7 MG/100ML-% IV SOLN
1000.0000 mg | INTRAVENOUS | Status: AC
  Administered 2019-11-08: 1000 mg via INTRAVENOUS

## 2019-11-08 MED ORDER — CHLORHEXIDINE GLUCONATE 0.12 % MT SOLN: 15.0000 mL | Freq: Once | OROMUCOSAL | Status: AC

## 2019-11-08 MED ORDER — ACETAMINOPHEN 325 MG PO TABS: 325.0000 mg | ORAL_TABLET | Freq: Four times a day (QID) | ORAL | Status: DC | PRN

## 2019-11-08 MED ORDER — TRANEXAMIC ACID-NACL 1000-0.7 MG/100ML-% IV SOLN
INTRAVENOUS | Status: AC
  Filled 2019-11-08: qty 100

## 2019-11-08 MED ORDER — GLYCOPYRROLATE 0.2 MG/ML IJ SOLN
INTRAMUSCULAR | Status: DC | PRN
  Administered 2019-11-08 (×2): .1 mg via INTRAVENOUS

## 2019-11-08 MED ORDER — LACTATED RINGERS IV BOLUS: 250.0000 mL | Freq: Once | INTRAVENOUS | Status: DC

## 2019-11-08 MED ORDER — FERROUS SULFATE 325 (65 FE) MG PO TABS
325.0000 mg | ORAL_TABLET | Freq: Two times a day (BID) | ORAL | Status: DC
  Filled 2019-11-08: qty 1

## 2019-11-08 MED ORDER — GLYCOPYRROLATE 0.2 MG/ML IJ SOLN
INTRAMUSCULAR | Status: AC
  Filled 2019-11-08: qty 1

## 2019-11-08 MED ORDER — ALUM & MAG HYDROXIDE-SIMETH 200-200-20 MG/5ML PO SUSP: 30.0000 mL | ORAL | Status: DC | PRN

## 2019-11-08 MED ORDER — ACETAMINOPHEN 10 MG/ML IV SOLN
INTRAVENOUS | Status: AC
  Administered 2019-11-08: 1000 mg
  Filled 2019-11-08: qty 100

## 2019-11-08 MED ORDER — CELECOXIB 200 MG PO CAPS: 400.0000 mg | ORAL_CAPSULE | Freq: Once | ORAL | Status: AC

## 2019-11-08 MED ORDER — OXYCODONE HCL 5 MG PO TABS: 5.0000 mg | ORAL_TABLET | ORAL | 0 refills | Status: DC | PRN

## 2019-11-08 MED ORDER — ONDANSETRON HCL 4 MG PO TABS: 4.0000 mg | ORAL_TABLET | Freq: Four times a day (QID) | ORAL | Status: DC | PRN

## 2019-11-08 MED ORDER — DIPHENHYDRAMINE HCL 12.5 MG/5ML PO ELIX
12.5000 mg | ORAL_SOLUTION | ORAL | Status: DC | PRN
  Filled 2019-11-08: qty 10

## 2019-11-08 MED ORDER — TRAMADOL HCL 50 MG PO TABS: 50.0000 mg | ORAL_TABLET | Freq: Four times a day (QID) | ORAL | 0 refills | Status: DC | PRN

## 2019-11-08 MED ORDER — CELECOXIB 200 MG PO CAPS: 200.0000 mg | ORAL_CAPSULE | Freq: Two times a day (BID) | ORAL | 2 refills | Status: AC

## 2019-11-08 MED ORDER — FENTANYL CITRATE (PF) 100 MCG/2ML IJ SOLN
INTRAMUSCULAR | Status: DC | PRN
Start: 2019-11-08 — End: 2019-11-08
  Administered 2019-11-08 (×2): 50 ug via INTRAVENOUS

## 2019-11-08 MED ORDER — SODIUM CHLORIDE 0.9 % IV SOLN
INTRAVENOUS | Status: DC | PRN
  Administered 2019-11-08: 25 ug/min via INTRAVENOUS

## 2019-11-08 MED ORDER — FENTANYL CITRATE (PF) 100 MCG/2ML IJ SOLN
INTRAMUSCULAR | Status: AC
  Filled 2019-11-08: qty 2

## 2019-11-08 MED ORDER — METOCLOPRAMIDE HCL 10 MG PO TABS: 10.0000 mg | ORAL_TABLET | Freq: Three times a day (TID) | ORAL | Status: DC

## 2019-11-08 MED ORDER — SODIUM CHLORIDE 0.9 % IV SOLN: INTRAVENOUS | Status: DC

## 2019-11-08 MED ORDER — NEOMYCIN-POLYMYXIN B GU 40-200000 IR SOLN
Status: DC | PRN
  Administered 2019-11-08: 16 mL

## 2019-11-08 MED ORDER — CHLORHEXIDINE GLUCONATE 0.12 % MT SOLN
OROMUCOSAL | Status: AC
  Administered 2019-11-08: 15 mL via OROMUCOSAL
  Filled 2019-11-08: qty 15

## 2019-11-08 MED ORDER — MAGNESIUM HYDROXIDE 400 MG/5ML PO SUSP
30.0000 mL | Freq: Every day | ORAL | Status: DC
  Filled 2019-11-08: qty 30

## 2019-11-08 MED ORDER — OXYCODONE HCL 5 MG PO TABS: 10.0000 mg | ORAL_TABLET | ORAL | Status: DC | PRN

## 2019-11-08 MED ORDER — ORAL CARE MOUTH RINSE: 15.0000 mL | Freq: Once | OROMUCOSAL | Status: AC

## 2019-11-08 MED ORDER — DEXAMETHASONE SODIUM PHOSPHATE 10 MG/ML IJ SOLN
INTRAMUSCULAR | Status: AC
  Administered 2019-11-08: 8 mg via INTRAVENOUS
  Filled 2019-11-08: qty 1

## 2019-11-08 MED ORDER — DEXAMETHASONE SODIUM PHOSPHATE 10 MG/ML IJ SOLN: 8.0000 mg | Freq: Once | INTRAMUSCULAR | Status: AC

## 2019-11-08 MED ORDER — LACTATED RINGERS IV SOLN: INTRAVENOUS | Status: DC

## 2019-11-08 MED ORDER — ENOXAPARIN SODIUM 40 MG/0.4ML ~~LOC~~ SOLN: 40.0000 mg | SUBCUTANEOUS | 0 refills | Status: DC

## 2019-11-08 MED ORDER — FAMOTIDINE 20 MG PO TABS: 20.0000 mg | ORAL_TABLET | Freq: Once | ORAL | Status: AC

## 2019-11-08 MED ORDER — FAMOTIDINE 20 MG PO TABS
ORAL_TABLET | ORAL | Status: AC
  Administered 2019-11-08: 20 mg via ORAL
  Filled 2019-11-08: qty 1

## 2019-11-08 MED ORDER — OXYCODONE HCL 5 MG/5ML PO SOLN: 5.0000 mg | Freq: Once | ORAL | Status: DC | PRN

## 2019-11-08 SURGICAL SUPPLY — 67 items
ARTICULEZE HEAD (Hips) ×2 IMPLANT
BLADE DRUM FLTD (BLADE) ×2 IMPLANT
BLADE SAW 90X25X1.19 OSCILLAT (BLADE) ×2 IMPLANT
CANISTER PREVENA 45 (CANNISTER) ×1 IMPLANT
CANISTER SUCT 1200ML W/VALVE (MISCELLANEOUS) ×2 IMPLANT
CANISTER SUCT 3000ML PPV (MISCELLANEOUS) ×4 IMPLANT
CARTRIDGE OIL MAESTRO DRILL (MISCELLANEOUS) ×1 IMPLANT
COVER WAND RF STERILE (DRAPES) ×2 IMPLANT
CUP PINNACLE 100 SERIES 58MM (Hips) ×1 IMPLANT
DIFFUSER DRILL AIR PNEUMATIC (MISCELLANEOUS) ×2 IMPLANT
DRAPE 3/4 80X56 (DRAPES) ×2 IMPLANT
DRAPE INCISE IOBAN 66X60 STRL (DRAPES) ×2 IMPLANT
DRSG DERMACEA 8X12 NADH (GAUZE/BANDAGES/DRESSINGS) ×2 IMPLANT
DRSG MEPILEX SACRM 8.7X9.8 (GAUZE/BANDAGES/DRESSINGS) ×2 IMPLANT
DRSG OPSITE POSTOP 4X12 (GAUZE/BANDAGES/DRESSINGS) ×1 IMPLANT
DRSG OPSITE POSTOP 4X14 (GAUZE/BANDAGES/DRESSINGS) IMPLANT
DRSG TEGADERM 4X4.75 (GAUZE/BANDAGES/DRESSINGS) ×2 IMPLANT
DURAPREP 26ML APPLICATOR (WOUND CARE) ×2 IMPLANT
ELECT REM PT RETURN 9FT ADLT (ELECTROSURGICAL) ×2
ELECTRODE REM PT RTRN 9FT ADLT (ELECTROSURGICAL) ×1 IMPLANT
GLOVE BIO SURGEON STRL SZ7.5 (GLOVE) ×4 IMPLANT
GLOVE BIOGEL M STRL SZ7.5 (GLOVE) ×4 IMPLANT
GLOVE BIOGEL PI IND STRL 7.5 (GLOVE) ×1 IMPLANT
GLOVE BIOGEL PI INDICATOR 7.5 (GLOVE) ×1
GLOVE INDICATOR 8.0 STRL GRN (GLOVE) ×2 IMPLANT
GOWN STRL REUS W/ TWL LRG LVL3 (GOWN DISPOSABLE) ×2 IMPLANT
GOWN STRL REUS W/ TWL XL LVL3 (GOWN DISPOSABLE) ×1 IMPLANT
GOWN STRL REUS W/TWL LRG LVL3 (GOWN DISPOSABLE) ×4
GOWN STRL REUS W/TWL XL LVL3 (GOWN DISPOSABLE) ×2
HEAD ARTICULEZE (Hips) IMPLANT
HEMOVAC 400CC 10FR (MISCELLANEOUS) ×1 IMPLANT
HOLDER FOLEY CATH W/STRAP (MISCELLANEOUS) ×2 IMPLANT
HOOD PEEL AWAY FLYTE STAYCOOL (MISCELLANEOUS) ×4 IMPLANT
IRRIGATION SURGIPHOR STRL (IV SOLUTION) ×2 IMPLANT
KIT PREVENA INCISION MGT20CM45 (CANNISTER) ×1 IMPLANT
KIT TURNOVER KIT A (KITS) ×2 IMPLANT
LINER MARATHON 10 DEG 36MM (Hips) ×2 IMPLANT
LINER MARATHON 10D 36MM (Hips) IMPLANT
MANIFOLD NEPTUNE II (INSTRUMENTS) ×2 IMPLANT
NDL SAFETY ECLIPSE 18X1.5 (NEEDLE) ×1 IMPLANT
NEEDLE HYPO 18GX1.5 SHARP (NEEDLE) ×2
NS IRRIG 500ML POUR BTL (IV SOLUTION) ×2 IMPLANT
OIL CARTRIDGE MAESTRO DRILL (MISCELLANEOUS) ×2
PACK HIP PROSTHESIS (MISCELLANEOUS) ×2 IMPLANT
PENCIL SMOKE ULTRAEVAC 22 CON (MISCELLANEOUS) ×2 IMPLANT
PIN STEIN THRED 5/32 (Pin) ×2 IMPLANT
PULSAVAC PLUS IRRIG FAN TIP (DISPOSABLE) ×2
SOL .9 NS 3000ML IRR  AL (IV SOLUTION) ×1
SOL .9 NS 3000ML IRR AL (IV SOLUTION) ×1
SOL .9 NS 3000ML IRR UROMATIC (IV SOLUTION) ×1 IMPLANT
SOL PREP PVP 2OZ (MISCELLANEOUS) ×2
SOLUTION PREP PVP 2OZ (MISCELLANEOUS) ×1 IMPLANT
SPONGE DRAIN TRACH 4X4 STRL 2S (GAUZE/BANDAGES/DRESSINGS) ×1 IMPLANT
STAPLER SKIN PROX 35W (STAPLE) ×2 IMPLANT
STEM FEM CMNTLSS SM AML 15.0 (Hips) ×1 IMPLANT
SUT ETHIBOND #5 BRAIDED 30INL (SUTURE) ×2 IMPLANT
SUT VIC AB 0 CT1 36 (SUTURE) ×2 IMPLANT
SUT VIC AB 1 CT1 36 (SUTURE) ×4 IMPLANT
SUT VIC AB 2-0 CT1 27 (SUTURE) ×2
SUT VIC AB 2-0 CT1 TAPERPNT 27 (SUTURE) ×1 IMPLANT
SYR 20ML LL LF (SYRINGE) ×2 IMPLANT
TAPE CLOTH 3X10 WHT NS LF (GAUZE/BANDAGES/DRESSINGS) ×2 IMPLANT
TAPE TRANSPORE STRL 2 31045 (GAUZE/BANDAGES/DRESSINGS) ×2 IMPLANT
TIP FAN IRRIG PULSAVAC PLUS (DISPOSABLE) ×1 IMPLANT
TOWEL OR 17X26 4PK STRL BLUE (TOWEL DISPOSABLE) ×2 IMPLANT
TRAY FOLEY MTR SLVR 16FR STAT (SET/KITS/TRAYS/PACK) ×2 IMPLANT
TUBING CONNECTING 10 (TUBING) ×1 IMPLANT

## 2019-11-08 NOTE — Op Note (Signed)
OPERATIVE NOTE  DATE OF SURGERY:  11/08/2019  PATIENT NAME:  Erik Peck   DOB: 12-04-1950  MRN: [id number]  PRE-OPERATIVE DIAGNOSIS: Degenerative arthrosis of the left hip, primary  POST-OPERATIVE DIAGNOSIS:  Same  PROCEDURE:  Left total hip arthroplasty  SURGEON:  Jena Gauss. M.D.  ASSISTANT: Baldwin Jamaica, PA-C (present and scrubbed throughout the case, critical for assistance with exposure, retraction, instrumentation, and closure)  ANESTHESIA: spinal  ESTIMATED BLOOD LOSS: 50 mL  FLUIDS REPLACED: 1400 mL of crystalloid  DRAINS: Wound VAC  IMPLANTS UTILIZED: DePuy 15 mm small stature AML femoral stem, 58 mm OD Pinnacle 100 acetabular component, neutral Pinnacle Marathon polyethylene insert, and a 36 mm M-SPEC +5 mm hip ball  INDICATIONS FOR SURGERY: EZRIEL BOFFA is a 69 y.o. year old male with a history of rheumatoid arthritis and progressive hip and groin  pain. X-rays demonstrated severe degenerative changes. The patient had not seen any significant improvement despite conservative nonsurgical intervention. After discussion of the risks and benefits of surgical intervention, the patient expressed understanding of the risks benefits and agree with plans for total hip arthroplasty.   The risks, benefits, and alternatives were discussed at length including but not limited to the risks of infection, bleeding, nerve injury, stiffness, blood clots, the need for revision surgery, limb length inequality, dislocation, cardiopulmonary complications, among others, and they were willing to proceed.  PROCEDURE IN DETAIL: The patient was brought into the operating room and, after adequate spinal anesthesia was achieved, the patient was placed in a right lateral decubitus position. Axillary roll was placed and all bony prominences were well-padded. The patient's left hip was cleaned and prepped with alcohol and DuraPrep and draped in the usual sterile fashion. A "timeout" was performed  as per usual protocol. A lateral curvilinear incision was made gently curving towards the posterior superior iliac spine. The IT band was incised in line with the skin incision and the fibers of the gluteus maximus were split in line. The piriformis tendon was identified, skeletonized, and incised at its insertion to the proximal femur and reflected posteriorly. A T type posterior capsulotomy was performed. Prior to dislocation of the femoral head, a threaded Steinmann pin was inserted through a separate stab incision into the pelvis superior to the acetabulum and bent in the form of a stylus so as to assess limb length and hip offset throughout the procedure. The femoral head was then dislocated posteriorly. Inspection of the femoral head demonstrated severe degenerative changes with full-thickness loss of articular cartilage. The femoral neck cut was performed using an oscillating saw. The anterior capsule was elevated off of the femoral neck using a periosteal elevator. Attention was then directed to the acetabulum. The remnant of the labrum was excised using electrocautery. Inspection of the acetabulum also demonstrated significant degenerative changes. The acetabulum was reamed in sequential fashion up to a 57 mm diameter. Good punctate bleeding bone was encountered. A 58 mm Pinnacle 100 acetabular component was positioned and impacted into place. Good scratch fit was appreciated. A neutral polyethylene trial was inserted.  Attention was then directed to the proximal femur. A hole for reaming of the proximal femoral canal was created using a high-speed burr. The femoral canal was reamed in sequential fashion up to a 14.5 mm diameter. This allowed for approximately 5 cm of scratch fit with extremely tight fit distally.  It was thus elected to ream up to a 50 mm diameter to allow for a line to line fit.. Serial  broaches were inserted up to a 15 mm ball stature femoral broach. Calcar region was planed and a  trial reduction was performed using a 36 mm hip ball with a +5 mm neck length. Good equalization of limb lengths and hip offset was appreciated and excellent stability was noted both anteriorly and posteriorly. Trial components were removed. The acetabular shell was irrigated with copious amounts of normal saline with antibiotic solution and suctioned dry. A neutral Pinnacle Marathon polyethylene insert was positioned and impacted into place. Next, a 15 mm small stature AML femoral stem was positioned and impacted into place. Excellent scratch fit was appreciated. A trial reduction was again performed with a 36 mm hip ball with a +5 mm neck length. Again, good equalization of limb lengths was appreciated and excellent stability appreciated both anteriorly and posteriorly. The hip was then dislocated and the trial hip ball was removed. The Morse taper was cleaned and dried. A 36 mm M-SPEC hip ball with a +5 mm neck length was placed on the trunnion and impacted into place. The hip was then reduced and placed through range of motion. Excellent stability was appreciated both anteriorly and posteriorly.  The wound was irrigated with copious amounts of normal saline followed by 500 ml of Surgiphor and suctioned dry. Good hemostasis was appreciated. The posterior capsulotomy was repaired using #5 Ethibond. Piriformis tendon was reapproximated to the undersurface of the gluteus medius tendon using #5 Ethibond. The IT band was reapproximated using interrupted sutures of #1 Vicryl. Subcutaneous tissue was approximated using first #0 Vicryl followed by #2-0 Vicryl. The skin was closed with skin staples.  The patient tolerated the procedure well and was transported to the recovery room in stable condition.   Jena Gauss., M.D.

## 2019-11-08 NOTE — Anesthesia Postprocedure Evaluation (Signed)
Anesthesia Post Note  Patient: Erik Peck  Procedure(s) Performed: TOTAL HIP ARTHROPLASTY (Left Hip)  Patient location during evaluation: PACU Anesthesia Type: Spinal Level of consciousness: oriented and awake and alert Pain management: pain level controlled Vital Signs Assessment: post-procedure vital signs reviewed and stable Respiratory status: spontaneous breathing and respiratory function stable Cardiovascular status: blood pressure returned to baseline and stable Postop Assessment: no headache, no backache, no apparent nausea or vomiting and patient able to bend at knees Anesthetic complications: no   No complications documented.   Last Vitals:  Vitals:   11/08/19 1157 11/08/19 1216  BP: (!) 164/102   Pulse: 66 (!) 58  Resp: 13 16  Temp: (!) 36.2 C (!) 36.1 C  SpO2: 99% 99%    Last Pain:  Vitals:   11/08/19 1216  TempSrc: Temporal  PainSc: 0-No pain                 Cleda Mccreedy [place]

## 2019-11-08 NOTE — OR Nursing (Signed)
OT in for evaluation @ 1551.

## 2019-11-08 NOTE — Discharge Instructions (Addendum)
Instructions after Total Hip Replacement     James P. Hooten, Jr., M.D.     Dept. of Orthopaedics & Sports Medicine  [facility]  [address]  [place], [place]  [address]  Phone: [phone]   Fax: [phone]    DIET: . Drink plenty of non-alcoholic fluids. . Resume your normal diet. Include foods high in fiber.  ACTIVITY:  . You may use crutches or a walker with weight-bearing as tolerated, unless instructed otherwise. . You may be weaned off of the walker or crutches by your Physical Therapist.  . Do NOT reach below the level of your knees or cross your legs until allowed.    . Continue doing gentle exercises. Exercising will reduce the pain and swelling, increase motion, and prevent muscle weakness.   . Please continue to use the TED compression stockings for 6 weeks. You may remove the stockings at night, but should reapply them in the morning. . Do not drive or operate any equipment until instructed.  WOUND CARE:  . Continue to use ice packs periodically to reduce pain and swelling. . Keep the incision clean and dry. . You may bathe or shower after the staples are removed at the first office visit following surgery.  MEDICATIONS: . You may resume your regular medications. . Please take the pain medication as prescribed on the medication. . Do not take pain medication on an empty stomach. . You have been given a prescription for a blood thinner to prevent blood clots. Please take the medication as instructed. (NOTE: After completing a 2 week course of Lovenox, take one Enteric-coated aspirin once a day.) . Pain medications and iron supplements can cause constipation. Use a stool softener (Senokot or Colace) on a daily basis and a laxative (dulcolax or miralax) as needed. . Do not drive or drink alcoholic beverages when taking pain medications.  CALL THE OFFICE FOR: . Temperature above 101 degrees . Excessive bleeding or drainage on the dressing. . Excessive  swelling, coldness, or paleness of the toes. . Persistent nausea and vomiting.  FOLLOW-UP:  . You should have an appointment to return to the office in 6 weeks after surgery. . Arrangements have been made for continuation of Physical Therapy (either home therapy or outpatient therapy).     [facility] Department Directory         [email or web address]       [email or web address]          Cardiology  Appointments: [place] - [phone] [place] - [phone]  Endocrinology  Appointments: [place] - [phone] [place] - [phone]  Gastroenterology  Appointments: [place] - [phone] [place] - [phone]        General Surgery   Appointments: [place] - [phone]  Internal Medicine/Family Medicine  Appointments: [place] - [phone] [place] - [phone] [place] - [phone]  Metabolic and Weigh Loss Surgery  Appointments: [place] - [phone]        Neurology  Appointments: [place] - [phone] [place] - [phone]  Neurosurgery  Appointments: [place] - [phone]  Obstetrics & Gynecology  Appointments: [place] - [phone] [place] - [phone]        Pediatrics  Appointments: [place] - [phone] [place] - [phone]  Physiatry  Appointments: [place] -[phone]  Physical Therapy  Appointments: [place] - [phone] [place] - [phone]        Podiatry  Appointments: [place] - [phone] [place] - [phone]  Pulmonology  Appointments: [place] - [phone]  Rheumatology  Appointments: [place] - [phone]        [place] Location: [facility]  [address] [place], [place]  [address]  [place] Location: [facility] [address] [place], [place]  [address]  [place] Location: [facility] [address] [place], [place]  [address]    

## 2019-11-08 NOTE — Evaluation (Signed)
Physical Therapy Evaluation Patient Details Name: Erik Peck MRN: [id number] DOB: 1950-08-21 Today's Date: 11/08/2019   History of Present Illness  BRONDON WANN is a 69 y.o. male that presents to clinic today for his preoperative history and evaluation. Patient presents unaccompanied. The patient is scheduled to undergo a left total hip arthroplasty on 11/08/19 by Dr. Ernest Pine. His pain began around 3 years ago. The pain is located in he left hip and groin. He describes his pain as worse with weightbearing and movement of the hip.He denies associated numbness or tingling.  Clinical Impression  Patient received in bed, agrees to PT session. Wife present. He requires mod assist to don pajama pants in supine. Instructed patient in HEP, performed supine to sit with mod assist. He stood with min assist and became dizzy upon standing. After a couple of minutes, he continued to feel dizzy and was returned to sitting on side of bed. Requested to lie back down and required mod +2 for this. He will continue to benefit from skilled PT while here to improve functional independence.       Follow Up Recommendations Home health PT;Supervision for mobility/OOB    Equipment Recommendations  None recommended by PT    Recommendations for Other Services       Precautions / Restrictions Precautions Precautions: Fall;Posterior Hip Precaution Booklet Issued: Yes (comment) Restrictions Weight Bearing Restrictions: No Other Position/Activity Restrictions: WBAT L LE      Mobility  Bed Mobility Overal bed mobility: Needs Assistance Bed Mobility: Supine to Sit;Sit to Supine     Supine to sit: Min assist Sit to supine: Min assist   General bed mobility comments: min assist to raise trunk and to bring LEs off bed  Transfers Overall transfer level: Needs assistance Equipment used: Rolling walker (2 wheeled) Transfers: Sit to/from Stand Sit to Stand: Min guard         General transfer comment:  patient became dizzy and lightheaded after standing. Did not resolve with increased time/ deep breathing. Therefore sat him back on bed and continued to feel dizzy so got him back into supine with mod +2 assist. BP WNL at this time.  Ambulation/Gait                Stairs            Wheelchair Mobility    Modified Rankin (Stroke Patients Only)       Balance Overall balance assessment: Needs assistance Sitting-balance support: Feet supported Sitting balance-Leahy Scale: Good     Standing balance support: Bilateral upper extremity supported;During functional activity Standing balance-Leahy Scale: Fair Standing balance comment: Reliant on RW and external assist                             Pertinent Vitals/Pain Pain Assessment: Faces Faces Pain Scale: Hurts a little bit Pain Location: L LE Pain Descriptors / Indicators: Aching;Sore;Operative site guarding Pain Intervention(s): Monitored during session;Limited activity within patient's tolerance;Repositioned    Home Living Family/patient expects to be discharged to:: Private residence Living Arrangements: Spouse/significant other Available Help at Discharge: Family;Available 24 hours/day Type of Home: House Home Access: Ramped entrance     Home Layout: One level Home Equipment: Crutches;Walker - 2 wheels      Prior Function Level of Independence: Independent               Hand Dominance        Extremity/Trunk Assessment  Upper Extremity Assessment Upper Extremity Assessment: Defer to OT evaluation    Lower Extremity Assessment Lower Extremity Assessment: LLE deficits/detail LLE Sensation: WNL LLE Coordination: WNL    Cervical / Trunk Assessment Cervical / Trunk Assessment: Normal  Communication   Communication: No difficulties  Cognition Arousal/Alertness: Awake/alert Behavior During Therapy: WFL for tasks assessed/performed Overall Cognitive Status: Within Functional Limits  for tasks assessed                                        General Comments      Exercises Total Joint Exercises Ankle Circles/Pumps: AROM;10 reps;Both Quad Sets: AROM;Left;10 reps Gluteal Sets: AROM;Left;10 reps Towel Squeeze: AROM;Both;10 reps Short Arc Quad: AROM;Left;10 reps Heel Slides: AROM;Left;10 reps Hip ABduction/ADduction: AROM;Left;10 reps   Assessment/Plan    PT Assessment Patient needs continued PT services  PT Problem List Decreased strength;Decreased mobility;Decreased activity tolerance;Decreased balance;Pain       PT Treatment Interventions DME instruction;Therapeutic activities;Gait training;Therapeutic exercise;Patient/family education;Functional mobility training;Balance training    PT Goals (Current goals can be found in the Care Plan section)  Acute Rehab PT Goals Patient Stated Goal: to return home PT Goal Formulation: With patient/family Time For Goal Achievement: 11/11/19 Potential to Achieve Goals: Good    Frequency 7X/week   Barriers to discharge        Co-evaluation               AM-PAC PT "6 Clicks" Mobility  Outcome Measure Help needed turning from your back to your side while in a flat bed without using bedrails?: A Little Help needed moving from lying on your back to sitting on the side of a flat bed without using bedrails?: A Little Help needed moving to and from a bed to a chair (including a wheelchair)?: A Lot Help needed standing up from a chair using your arms (e.g., wheelchair or bedside chair)?: A Little Help needed to walk in hospital room?: A Lot Help needed climbing 3-5 steps with a railing? : Total 6 Click Score: 14    End of Session   Activity Tolerance: Other (comment) (Patient limited by dizziness, faint) Patient left: in bed;with call bell/phone within reach Nurse Communication: Other (comment) (reported patient feeling faint with standing.) PT Visit Diagnosis: Difficulty in walking, not  elsewhere classified (R26.2)    Time: 1430-1500 PT Time Calculation (min) (ACUTE ONLY): 30 min   Charges:   PT Evaluation $PT Eval Moderate Complexity: 1 Mod PT Treatments $Therapeutic Exercise: 8-22 mins        Daaiel Starlin, PT, GCS 11/08/19,3:55 PM

## 2019-11-08 NOTE — OR Nursing (Addendum)
PT in for eval @ 1445. Update 1510 - PT advises "he got light headed when we got him up"; back to bed.  PT asks if 2nd bolus in postop can be given; Dr. Ernest Pine in shortly thereafter and advises ok to give 2nd bolus in postop LR .  (He's aware pt had 1st bolus in pacu.)  PT advises they will return approx 1 hr. Update 1600 - PT back in for evaluation.  Discharge pending MD visit to postop. Update 1745 - MD in to see pt, advises ok to discharge to home.

## 2019-11-08 NOTE — Progress Notes (Signed)
Physical Therapy Treatment Patient Details Name: Erik Peck MRN: [id number] DOB: Aug 19, 1950 Today's Date: 11/08/2019    History of Present Illness Erik Peck is a 69 y.o. male is scheduled to undergo a left total hip arthroplasty on 11/08/19 by Dr. Ernest Pine. His pain began around 3 years ago. The pain is located in he left hip and groin. He describes his pain as worse with weightbearing and movement of the hip.He denies associated numbness or tingling.    PT Comments    Patient received in recliner at end of OT session. He reports he is feeling better and is ready to walk. He performed transfers with supervision. Good teach back of technique. He ambulated 175 feet with RW and min guard/supervision. He will continue to benefit from skilled PT while here to improve functional mobility and independence.     Follow Up Recommendations  Home health PT;Supervision for mobility/OOB     Equipment Recommendations  None recommended by PT    Recommendations for Other Services       Precautions / Restrictions Precautions Precautions: Fall;Posterior Hip Precaution Booklet Issued: Yes (comment) Restrictions Weight Bearing Restrictions: Yes LLE Weight Bearing: Weight bearing as tolerated Other Position/Activity Restrictions: WBAT L LE    Mobility  Bed Mobility Overal bed mobility: Needs Assistance Bed Mobility: Supine to Sit;Sit to Supine     Supine to sit: Min assist Sit to supine: Min assist   General bed mobility comments: patient up in recliner upon arrival  Transfers Overall transfer level: Needs assistance Equipment used: Rolling walker (2 wheeled) Transfers: Sit to/from Stand Sit to Stand: Min guard         General transfer comment: cues for hand/foot placement to maintain precautions with good carryover  Ambulation/Gait Ambulation/Gait assistance: Min guard Gait Distance (Feet): 175 Feet Assistive device: Rolling walker (2 wheeled) Gait Pattern/deviations:  Step-through pattern;Decreased stride length Gait velocity: slightly decreased   General Gait Details: good pacing and quailty of gait. No increased pain or difficulty noted or reported.   Stairs             Wheelchair Mobility    Modified Rankin (Stroke Patients Only)       Balance Overall balance assessment: Modified Independent Sitting-balance support: Feet supported Sitting balance-Leahy Scale: Good     Standing balance support: Bilateral upper extremity supported;During functional activity Standing balance-Leahy Scale: Good Standing balance comment: Reliant on RW                            Cognition Arousal/Alertness: Awake/alert Behavior During Therapy: WFL for tasks assessed/performed Overall Cognitive Status: Within Functional Limits for tasks assessed                                        Exercises Total Joint Exercises Ankle Circles/Pumps: AROM;10 reps;Both Quad Sets: AROM;Left;10 reps Gluteal Sets: AROM;Left;10 reps Towel Squeeze: AROM;Both;10 reps Short Arc Quad: AROM;Left;10 reps Heel Slides: AROM;Left;10 reps Hip ABduction/ADduction: AROM;Left;10 reps Other Exercises Other Exercises: Pt/spouse instructed in falls prevention, home/routines modifications, how to maintain posterior THPs to maximize safety/indep with ADL; handout provided    General Comments        Pertinent Vitals/Pain Pain Assessment: No/denies pain Faces Pain Scale: Hurts a little bit Pain Location: L LE Pain Descriptors / Indicators: Discomfort Pain Intervention(s): Monitored during session    Home Living Family/patient expects  to be discharged to:: Private residence Living Arrangements: Spouse/significant other Available Help at Discharge: Family;Available 24 hours/day Type of Home: House Home Access: Ramped entrance   Home Layout: One level Home Equipment: Crutches;Walker - 2 wheels;Bedside commode      Prior Function Level of  Independence: Independent          PT Goals (current goals can now be found in the care plan section) Acute Rehab PT Goals Patient Stated Goal: to return home PT Goal Formulation: With patient/family Time For Goal Achievement: 11/08/19 Potential to Achieve Goals: Good Progress towards PT goals: Progressing toward goals    Frequency    7X/week      PT Plan Current plan remains appropriate    Co-evaluation              AM-PAC PT "6 Clicks" Mobility   Outcome Measure  Help needed turning from your back to your side while in a flat bed without using bedrails?: A Little Help needed moving from lying on your back to sitting on the side of a flat bed without using bedrails?: A Little Help needed moving to and from a bed to a chair (including a wheelchair)?: A Little Help needed standing up from a chair using your arms (e.g., wheelchair or bedside chair)?: A Little Help needed to walk in hospital room?: A Little Help needed climbing 3-5 steps with a railing? : A Little 6 Click Score: 18    End of Session Equipment Utilized During Treatment: Gait belt Activity Tolerance: Patient tolerated treatment well Patient left: in chair;with family/visitor present Nurse Communication: Mobility status PT Visit Diagnosis: Difficulty in walking, not elsewhere classified (R26.2)     Time: 5366-4403 PT Time Calculation (min) (ACUTE ONLY): 16 min  Charges:  $Gait Training: 8-22 mins $Therapeutic Exercise: 8-22 mins                     Anaston Koehn, PT, GCS 11/08/19,4:37 PM

## 2019-11-08 NOTE — Anesthesia Procedure Notes (Signed)
Spinal  Patient location during procedure: OR Start time: 11/08/2019 7:14 AM End time: 11/08/2019 7:18 AM Staffing Performed: resident/CRNA  Anesthesiologist: Piscitello, Precious Haws, MD Resident/CRNA: Demetrius Charity, CRNA Preanesthetic Checklist Completed: patient identified, IV checked, site marked, risks and benefits discussed, surgical consent, monitors and equipment checked, pre-op evaluation and timeout performed Spinal Block Patient position: sitting Prep: ChloraPrep Patient monitoring: heart rate, continuous pulse ox, blood pressure and cardiac monitor Approach: midline Location: L2-3 Injection technique: single-shot Needle Needle type: Whitacre and Introducer  Needle gauge: 24 G Needle length: 9 cm Assessment Sensory level: T10 Additional Notes Negative paresthesia. Negative blood return. Positive free-flowing CSF. Expiration date of kit checked and confirmed. Patient tolerated procedure well, without complications.

## 2019-11-08 NOTE — H&P (Signed)
The patient has been re-examined, and the chart reviewed, and there have been no interval changes to the documented history and physical.    The risks, benefits, and alternatives have been discussed at length. The patient expressed understanding of the risks benefits and agreed with plans for surgical intervention.  Zackary Mckeone P. Bernita Beckstrom, Jr. M.D.    

## 2019-11-08 NOTE — Progress Notes (Signed)
   11/08/19 0800  Clinical Encounter Type  Visited With Family  Visit Type Initial  Referral From Chaplain  Consult/Referral To Chaplain  While rounding SDS waiting area, chaplain briefly visited with pt's wife to see how she was doing. Pt's wife was quietly resting, therefore chaplain made the visit short and wished her a Marvelous Monday.

## 2019-11-08 NOTE — Anesthesia Preprocedure Evaluation (Signed)
Anesthesia Evaluation  Patient identified by MRN, date of birth, ID band Patient awake    Reviewed: Allergy & Precautions, H&P , NPO status , Patient's Chart, lab work & pertinent test results  History of Anesthesia Complications (+) DIFFICULT IV STICK / SPECIAL LINE and history of anesthetic complications  Airway Mallampati: III  TM Distance: >3 FB Neck ROM: limited    Dental  (+) Upper Dentures, Lower Dentures   Pulmonary neg shortness of breath, former smoker,    Pulmonary exam normal        Cardiovascular Exercise Tolerance: Good hypertension, (-) angina(-) Past MI and (-) DOE Normal cardiovascular exam     Neuro/Psych negative neurological ROS  negative psych ROS   GI/Hepatic negative GI ROS, Neg liver ROS, neg GERD  ,  Endo/Other  negative endocrine ROS  Renal/GU      Musculoskeletal   Abdominal   Peds  Hematology negative hematology ROS (+)   Anesthesia Other Findings Past Medical History: No date: Anemia 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 No date: Hypertension 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  Past Surgical History: No date: CIRCUMCISION No date: TONSILLECTOMY     Reproductive/Obstetrics negative OB ROS                             Anesthesia Physical Anesthesia Plan  ASA: III  Anesthesia Plan: Spinal   Post-op Pain Management:    Induction:   PONV Risk Score and Plan:   Airway Management Planned: Natural Airway and Nasal Cannula  Additional Equipment:   Intra-op Plan:   Post-operative Plan:   Informed Consent: I have reviewed the patients History and Physical, chart, labs and discussed the procedure including the risks, benefits and alternatives for the proposed anesthesia with the patient or authorized representative who has indicated his/her understanding and acceptance.     Dental Advisory Given  Plan Discussed  with: Anesthesiologist, CRNA and Surgeon  Anesthesia Plan Comments: (Patient reports no bleeding problems and no anticoagulant use.  Plan for spinal with backup GA  Patient consented for risks of anesthesia including but not limited to:  - adverse reactions to medications - damage to eyes, teeth, lips or other oral mucosa - nerve damage due to positioning  - risk of bleeding, infection and or nerve damage from spinal that could lead to paralysis - risk of headache or failed spinal - damage to teeth, lips or other oral mucosa - sore throat or hoarseness - damage to heart, brain, nerves, lungs, other parts of body or loss of life  Patient voiced understanding.)        Anesthesia Quick Evaluation

## 2019-11-08 NOTE — Transfer of Care (Signed)
Immediate Anesthesia Transfer of Care Note  Patient: Erik Peck  Procedure(s) Performed: TOTAL HIP ARTHROPLASTY (Left Hip)  Patient Location: PACU  Anesthesia Type:Spinal  Level of Consciousness: drowsy  Airway & Oxygen Therapy: Patient Spontanous Breathing and Patient connected to face mask oxygen  Post-op Assessment: Report given to RN and Post -op Vital signs reviewed and stable  Post vital signs: Reviewed and stable  Last Vitals:  Vitals Value Taken Time  BP 135/94 11/08/19 1038  Temp    Pulse 57 11/08/19 1039  Resp 15 11/08/19 1039  SpO2 100 % 11/08/19 1039  Vitals shown include unvalidated device data.  Last Pain:  Vitals:   11/08/19 0627  PainSc: 0-No pain         Complications: No complications documented.

## 2019-11-08 NOTE — Evaluation (Signed)
Occupational Therapy Evaluation Patient Details Name: Erik Peck MRN: [id number] DOB: [birth year] Today's Date: 11/08/2019    History of Present Illness Erik Peck is a 69 y.o. male that presents to clinic today for his preoperative history and evaluation. Patient presents unaccompanied. The patient is scheduled to undergo a left total hip arthroplasty on 11/08/19 by Dr. Ernest Pine. His pain began around 3 years ago. The pain is located in he left hip and groin. He describes his pain as worse with weightbearing and movement of the hip.He denies associated numbness or tingling.   Clinical Impression   Pt seen for OT evaluation this date, POD#1 from above surgery. Pt was independent in all ADL prior to surgery and is eager to return to PLOF with less pain and improved safety and independence. Pt currently requires minimal assist for LB dressing while in seated position due to pain and limited AROM of L hip. Pt able to recall 3/3 posterior total hip precautions after initial instruction. Pt denies dizziness or pain. Pt/spouse instructed in posterior total hip precautions and how to implement, self care skills, falls prevention strategies, home/routines modifications, DME/AE for LB bathing and dressing tasks. Do not anticipate additional skilled OT needs at this time. Will sign off.       Follow Up Recommendations  No OT follow up    Equipment Recommendations  None recommended by OT    Recommendations for Other Services       Precautions / Restrictions Precautions Precautions: Fall;Posterior Hip Precaution Booklet Issued: Yes (comment) Restrictions Weight Bearing Restrictions: Yes LLE Weight Bearing: Weight bearing as tolerated Other Position/Activity Restrictions: WBAT L LE      Mobility Bed Mobility Overal bed mobility: Needs Assistance Bed Mobility: Supine to Sit;Sit to Supine     Supine to sit: Min assist Sit to supine: Min assist   General bed mobility comments: min assist  to raise trunk and to bring LEs off bed  Transfers Overall transfer level: Needs assistance Equipment used: Rolling walker (2 wheeled) Transfers: Sit to/from Stand Sit to Stand: Min guard         General transfer comment: cues for hand/foot placement to maintain precautions with good carryover    Balance Overall balance assessment: Needs assistance Sitting-balance support: Feet supported;No upper extremity supported Sitting balance-Leahy Scale: Good     Standing balance support: Bilateral upper extremity supported;During functional activity Standing balance-Leahy Scale: Fair Standing balance comment: Reliant on RW and external assist                           ADL either performed or assessed with clinical judgement   ADL Overall ADL's : Needs assistance/impaired                                       General ADL Comments: Min A for LB ADL 2/2 hip precautions, CGA for ADL transfers, cues for precautions; family able to provide needed level of assist     Vision Patient Visual Report: No change from baseline       Perception     Praxis      Pertinent Vitals/Pain Pain Assessment: No/denies pain Faces Pain Scale: Hurts a little bit Pain Location: L LE Pain Descriptors / Indicators: Aching;Sore;Operative site guarding Pain Intervention(s): Monitored during session;Limited activity within patient's tolerance;Repositioned     Hand Dominance  Extremity/Trunk Assessment Upper Extremity Assessment Upper Extremity Assessment: Overall WFL for tasks assessed   Lower Extremity Assessment Lower Extremity Assessment: Defer to PT evaluation;LLE deficits/detail LLE Deficits / Details: post-op strength/ROM deficits LLE Sensation: WNL LLE Coordination: WNL   Cervical / Trunk Assessment Cervical / Trunk Assessment: Normal   Communication Communication Communication: No difficulties   Cognition Arousal/Alertness: Awake/alert Behavior During  Therapy: WFL for tasks assessed/performed Overall Cognitive Status: Within Functional Limits for tasks assessed                                     General Comments       Exercises Total Joint Exercises Ankle Circles/Pumps: AROM;10 reps;Both Quad Sets: AROM;Left;10 reps Gluteal Sets: AROM;Left;10 reps Towel Squeeze: AROM;Both;10 reps Short Arc Quad: AROM;Left;10 reps Heel Slides: AROM;Left;10 reps Hip ABduction/ADduction: AROM;Left;10 reps Other Exercises Other Exercises: Pt/spouse instructed in falls prevention, home/routines modifications, how to maintain posterior THPs to maximize safety/indep with ADL; handout provided   Shoulder Instructions      Home Living Family/patient expects to be discharged to:: Private residence Living Arrangements: Spouse/significant other Available Help at Discharge: Family;Available 24 hours/day Type of Home: House Home Access: Ramped entrance     Home Layout: One level         Bathroom Toilet: Standard     Home Equipment: Crutches;Walker - 2 wheels;Bedside commode          Prior Functioning/Environment Level of Independence: Independent                 OT Problem List: Decreased strength;Decreased range of motion;Impaired balance (sitting and/or standing)      OT Treatment/Interventions:      OT Goals(Current goals can be found in the care plan section) Acute Rehab OT Goals Patient Stated Goal: to return home OT Goal Formulation: All assessment and education complete, DC therapy  OT Frequency:     Barriers to D/C:            Co-evaluation              AM-PAC OT "6 Clicks" Daily Activity     Outcome Measure Help from another person eating meals?: None Help from another person taking care of personal grooming?: None Help from another person toileting, which includes using toliet, bedpan, or urinal?: A Little Help from another person bathing (including washing, rinsing, drying)?: A  Little Help from another person to put on and taking off regular upper body clothing?: None Help from another person to put on and taking off regular lower body clothing?: A Little 6 Click Score: 21   End of Session Equipment Utilized During Treatment: Gait belt;Rolling walker  Activity Tolerance: Patient tolerated treatment well Patient left: in chair;with call bell/phone within reach;with family/visitor present;Other (comment) (PT for session)  OT Visit Diagnosis: Other abnormalities of gait and mobility (R26.89)                Time: 1308-6578 OT Time Calculation (min): 13 min Charges:  OT General Charges $OT Visit: 1 Visit OT Evaluation $OT Eval Low Complexity: 1 Low OT Treatments $Self Care/Home Management : 8-22 mins  Richrd Prime, MPH, MS, OTR/L ascom [phone] 11/08/19, 4:08 PM

## 2019-11-09 ENCOUNTER — Encounter: Payer: Self-pay | Admitting: Orthopedic Surgery

## 2019-11-09 DIAGNOSIS — Z96652 Presence of left artificial knee joint: Secondary | ICD-10-CM | POA: Diagnosis not present

## 2019-11-09 DIAGNOSIS — I10 Essential (primary) hypertension: Secondary | ICD-10-CM | POA: Diagnosis not present

## 2019-11-09 DIAGNOSIS — Z471 Aftercare following joint replacement surgery: Secondary | ICD-10-CM | POA: Diagnosis not present

## 2019-11-09 DIAGNOSIS — M069 Rheumatoid arthritis, unspecified: Secondary | ICD-10-CM | POA: Diagnosis not present

## 2019-11-10 LAB — SURGICAL PATHOLOGY

## 2019-11-15 DIAGNOSIS — Z471 Aftercare following joint replacement surgery: Secondary | ICD-10-CM | POA: Diagnosis not present

## 2019-11-15 DIAGNOSIS — I10 Essential (primary) hypertension: Secondary | ICD-10-CM | POA: Diagnosis not present

## 2019-11-15 DIAGNOSIS — M069 Rheumatoid arthritis, unspecified: Secondary | ICD-10-CM | POA: Diagnosis not present

## 2019-11-15 DIAGNOSIS — Z96652 Presence of left artificial knee joint: Secondary | ICD-10-CM | POA: Diagnosis not present

## 2019-11-17 DIAGNOSIS — I10 Essential (primary) hypertension: Secondary | ICD-10-CM | POA: Diagnosis not present

## 2019-11-17 DIAGNOSIS — M069 Rheumatoid arthritis, unspecified: Secondary | ICD-10-CM | POA: Diagnosis not present

## 2019-11-17 DIAGNOSIS — Z96652 Presence of left artificial knee joint: Secondary | ICD-10-CM | POA: Diagnosis not present

## 2019-11-17 DIAGNOSIS — Z471 Aftercare following joint replacement surgery: Secondary | ICD-10-CM | POA: Diagnosis not present

## 2019-11-19 DIAGNOSIS — Z471 Aftercare following joint replacement surgery: Secondary | ICD-10-CM | POA: Diagnosis not present

## 2019-11-19 DIAGNOSIS — M069 Rheumatoid arthritis, unspecified: Secondary | ICD-10-CM | POA: Diagnosis not present

## 2019-11-19 DIAGNOSIS — Z96652 Presence of left artificial knee joint: Secondary | ICD-10-CM | POA: Diagnosis not present

## 2019-11-19 DIAGNOSIS — I10 Essential (primary) hypertension: Secondary | ICD-10-CM | POA: Diagnosis not present

## 2019-11-22 DIAGNOSIS — M069 Rheumatoid arthritis, unspecified: Secondary | ICD-10-CM | POA: Diagnosis not present

## 2019-11-22 DIAGNOSIS — I10 Essential (primary) hypertension: Secondary | ICD-10-CM | POA: Diagnosis not present

## 2019-11-22 DIAGNOSIS — Z471 Aftercare following joint replacement surgery: Secondary | ICD-10-CM | POA: Diagnosis not present

## 2019-11-22 DIAGNOSIS — Z96652 Presence of left artificial knee joint: Secondary | ICD-10-CM | POA: Diagnosis not present

## 2019-11-24 DIAGNOSIS — M069 Rheumatoid arthritis, unspecified: Secondary | ICD-10-CM | POA: Diagnosis not present

## 2019-11-24 DIAGNOSIS — I10 Essential (primary) hypertension: Secondary | ICD-10-CM | POA: Diagnosis not present

## 2019-11-24 DIAGNOSIS — Z471 Aftercare following joint replacement surgery: Secondary | ICD-10-CM | POA: Diagnosis not present

## 2019-11-24 DIAGNOSIS — Z96652 Presence of left artificial knee joint: Secondary | ICD-10-CM | POA: Diagnosis not present

## 2019-11-29 DIAGNOSIS — I10 Essential (primary) hypertension: Secondary | ICD-10-CM | POA: Diagnosis not present

## 2019-11-29 DIAGNOSIS — Z471 Aftercare following joint replacement surgery: Secondary | ICD-10-CM | POA: Diagnosis not present

## 2019-11-29 DIAGNOSIS — M069 Rheumatoid arthritis, unspecified: Secondary | ICD-10-CM | POA: Diagnosis not present

## 2019-11-29 DIAGNOSIS — Z96652 Presence of left artificial knee joint: Secondary | ICD-10-CM | POA: Diagnosis not present

## 2019-12-01 DIAGNOSIS — Z96652 Presence of left artificial knee joint: Secondary | ICD-10-CM | POA: Diagnosis not present

## 2019-12-01 DIAGNOSIS — I10 Essential (primary) hypertension: Secondary | ICD-10-CM | POA: Diagnosis not present

## 2019-12-01 DIAGNOSIS — M069 Rheumatoid arthritis, unspecified: Secondary | ICD-10-CM | POA: Diagnosis not present

## 2019-12-01 DIAGNOSIS — Z471 Aftercare following joint replacement surgery: Secondary | ICD-10-CM | POA: Diagnosis not present

## 2019-12-09 DIAGNOSIS — M069 Rheumatoid arthritis, unspecified: Secondary | ICD-10-CM | POA: Diagnosis not present

## 2019-12-09 DIAGNOSIS — Z471 Aftercare following joint replacement surgery: Secondary | ICD-10-CM | POA: Diagnosis not present

## 2019-12-09 DIAGNOSIS — Z96652 Presence of left artificial knee joint: Secondary | ICD-10-CM | POA: Diagnosis not present

## 2019-12-09 DIAGNOSIS — I10 Essential (primary) hypertension: Secondary | ICD-10-CM | POA: Diagnosis not present

## 2019-12-13 DIAGNOSIS — Z471 Aftercare following joint replacement surgery: Secondary | ICD-10-CM | POA: Diagnosis not present

## 2019-12-15 DIAGNOSIS — M069 Rheumatoid arthritis, unspecified: Secondary | ICD-10-CM | POA: Diagnosis not present

## 2019-12-15 DIAGNOSIS — Z96652 Presence of left artificial knee joint: Secondary | ICD-10-CM | POA: Diagnosis not present

## 2019-12-15 DIAGNOSIS — I10 Essential (primary) hypertension: Secondary | ICD-10-CM | POA: Diagnosis not present

## 2019-12-15 DIAGNOSIS — Z471 Aftercare following joint replacement surgery: Secondary | ICD-10-CM | POA: Diagnosis not present

## 2019-12-21 DIAGNOSIS — Z227 Latent tuberculosis: Secondary | ICD-10-CM | POA: Diagnosis not present

## 2019-12-21 DIAGNOSIS — M153 Secondary multiple arthritis: Secondary | ICD-10-CM | POA: Diagnosis not present

## 2019-12-21 DIAGNOSIS — Z96642 Presence of left artificial hip joint: Secondary | ICD-10-CM | POA: Diagnosis not present

## 2019-12-21 DIAGNOSIS — Z79899 Other long term (current) drug therapy: Secondary | ICD-10-CM | POA: Diagnosis not present

## 2019-12-21 DIAGNOSIS — M0579 Rheumatoid arthritis with rheumatoid factor of multiple sites without organ or systems involvement: Secondary | ICD-10-CM | POA: Diagnosis not present

## 2019-12-24 DIAGNOSIS — Z96642 Presence of left artificial hip joint: Secondary | ICD-10-CM | POA: Insufficient documentation

## 2020-02-01 ENCOUNTER — Other Ambulatory Visit: Payer: Self-pay

## 2020-02-01 ENCOUNTER — Ambulatory Visit (LOCAL_COMMUNITY_HEALTH_CENTER): Payer: Medicare HMO

## 2020-02-01 DIAGNOSIS — Z23 Encounter for immunization: Secondary | ICD-10-CM

## 2020-02-29 DIAGNOSIS — I10 Essential (primary) hypertension: Secondary | ICD-10-CM | POA: Diagnosis not present

## 2020-03-06 DIAGNOSIS — M0579 Rheumatoid arthritis with rheumatoid factor of multiple sites without organ or systems involvement: Secondary | ICD-10-CM | POA: Diagnosis not present

## 2020-03-06 DIAGNOSIS — Z0000 Encounter for general adult medical examination without abnormal findings: Secondary | ICD-10-CM | POA: Diagnosis not present

## 2020-03-06 DIAGNOSIS — D508 Other iron deficiency anemias: Secondary | ICD-10-CM | POA: Diagnosis not present

## 2020-03-06 DIAGNOSIS — Z125 Encounter for screening for malignant neoplasm of prostate: Secondary | ICD-10-CM | POA: Diagnosis not present

## 2020-03-06 DIAGNOSIS — I10 Essential (primary) hypertension: Secondary | ICD-10-CM | POA: Diagnosis not present

## 2020-04-19 DIAGNOSIS — M0579 Rheumatoid arthritis with rheumatoid factor of multiple sites without organ or systems involvement: Secondary | ICD-10-CM | POA: Diagnosis not present

## 2020-04-19 DIAGNOSIS — M7551 Bursitis of right shoulder: Secondary | ICD-10-CM | POA: Diagnosis not present

## 2020-04-19 DIAGNOSIS — M8949 Other hypertrophic osteoarthropathy, multiple sites: Secondary | ICD-10-CM | POA: Diagnosis not present

## 2020-04-19 DIAGNOSIS — D508 Other iron deficiency anemias: Secondary | ICD-10-CM | POA: Diagnosis not present

## 2020-04-19 DIAGNOSIS — Z79899 Other long term (current) drug therapy: Secondary | ICD-10-CM | POA: Diagnosis not present

## 2020-05-23 DIAGNOSIS — M0579 Rheumatoid arthritis with rheumatoid factor of multiple sites without organ or systems involvement: Secondary | ICD-10-CM | POA: Diagnosis not present

## 2020-05-23 DIAGNOSIS — M25511 Pain in right shoulder: Secondary | ICD-10-CM | POA: Diagnosis not present

## 2020-05-23 DIAGNOSIS — G8929 Other chronic pain: Secondary | ICD-10-CM | POA: Diagnosis not present

## 2020-06-05 DIAGNOSIS — M19011 Primary osteoarthritis, right shoulder: Secondary | ICD-10-CM | POA: Diagnosis not present

## 2020-06-05 DIAGNOSIS — M25511 Pain in right shoulder: Secondary | ICD-10-CM | POA: Diagnosis not present

## 2020-06-05 DIAGNOSIS — M7541 Impingement syndrome of right shoulder: Secondary | ICD-10-CM | POA: Diagnosis not present

## 2020-06-06 ENCOUNTER — Other Ambulatory Visit: Payer: Self-pay | Admitting: Physician Assistant

## 2020-06-06 DIAGNOSIS — M7541 Impingement syndrome of right shoulder: Secondary | ICD-10-CM

## 2020-06-06 DIAGNOSIS — M19011 Primary osteoarthritis, right shoulder: Secondary | ICD-10-CM

## 2020-06-14 ENCOUNTER — Other Ambulatory Visit: Payer: Self-pay | Admitting: Physician Assistant

## 2020-06-14 ENCOUNTER — Other Ambulatory Visit (HOSPITAL_COMMUNITY): Payer: Self-pay | Admitting: Physician Assistant

## 2020-06-14 DIAGNOSIS — M19011 Primary osteoarthritis, right shoulder: Secondary | ICD-10-CM

## 2020-06-14 DIAGNOSIS — M7541 Impingement syndrome of right shoulder: Secondary | ICD-10-CM

## 2020-06-26 ENCOUNTER — Other Ambulatory Visit: Payer: Self-pay

## 2020-06-26 ENCOUNTER — Ambulatory Visit
Admission: RE | Admit: 2020-06-26 | Discharge: 2020-06-26 | Disposition: A | Discharge status: Home / Self Care | Payer: Medicare HMO | Source: Ambulatory Visit | Attending: Physician Assistant | Admitting: Physician Assistant

## 2020-06-26 DIAGNOSIS — M19011 Primary osteoarthritis, right shoulder: Secondary | ICD-10-CM | POA: Insufficient documentation

## 2020-06-26 DIAGNOSIS — M75101 Unspecified rotator cuff tear or rupture of right shoulder, not specified as traumatic: Secondary | ICD-10-CM | POA: Diagnosis not present

## 2020-06-26 DIAGNOSIS — M2548 Effusion, other site: Secondary | ICD-10-CM | POA: Diagnosis not present

## 2020-06-26 DIAGNOSIS — M7541 Impingement syndrome of right shoulder: Secondary | ICD-10-CM | POA: Insufficient documentation

## 2020-06-26 DIAGNOSIS — M25411 Effusion, right shoulder: Secondary | ICD-10-CM | POA: Diagnosis not present

## 2020-07-20 DIAGNOSIS — Z79899 Other long term (current) drug therapy: Secondary | ICD-10-CM | POA: Diagnosis not present

## 2020-07-20 DIAGNOSIS — M0579 Rheumatoid arthritis with rheumatoid factor of multiple sites without organ or systems involvement: Secondary | ICD-10-CM | POA: Diagnosis not present

## 2020-07-20 DIAGNOSIS — M159 Polyosteoarthritis, unspecified: Secondary | ICD-10-CM | POA: Diagnosis not present

## 2020-08-16 DIAGNOSIS — H5203 Hypermetropia, bilateral: Secondary | ICD-10-CM | POA: Diagnosis not present

## 2020-08-29 DIAGNOSIS — I10 Essential (primary) hypertension: Secondary | ICD-10-CM | POA: Diagnosis not present

## 2020-08-29 DIAGNOSIS — Z125 Encounter for screening for malignant neoplasm of prostate: Secondary | ICD-10-CM | POA: Diagnosis not present

## 2020-09-05 DIAGNOSIS — Z87891 Personal history of nicotine dependence: Secondary | ICD-10-CM | POA: Diagnosis not present

## 2020-09-05 DIAGNOSIS — M0579 Rheumatoid arthritis with rheumatoid factor of multiple sites without organ or systems involvement: Secondary | ICD-10-CM | POA: Diagnosis not present

## 2020-09-05 DIAGNOSIS — I10 Essential (primary) hypertension: Secondary | ICD-10-CM | POA: Diagnosis not present

## 2020-09-05 DIAGNOSIS — D508 Other iron deficiency anemias: Secondary | ICD-10-CM | POA: Diagnosis not present

## 2020-09-05 DIAGNOSIS — Z23 Encounter for immunization: Secondary | ICD-10-CM | POA: Diagnosis not present

## 2020-09-05 DIAGNOSIS — Z125 Encounter for screening for malignant neoplasm of prostate: Secondary | ICD-10-CM | POA: Diagnosis not present

## 2020-09-05 DIAGNOSIS — Z1211 Encounter for screening for malignant neoplasm of colon: Secondary | ICD-10-CM | POA: Diagnosis not present

## 2020-09-05 DIAGNOSIS — Z0001 Encounter for general adult medical examination with abnormal findings: Secondary | ICD-10-CM | POA: Diagnosis not present

## 2020-11-28 DIAGNOSIS — K219 Gastro-esophageal reflux disease without esophagitis: Secondary | ICD-10-CM | POA: Insufficient documentation

## 2020-11-28 DIAGNOSIS — D508 Other iron deficiency anemias: Secondary | ICD-10-CM | POA: Diagnosis not present

## 2020-11-28 DIAGNOSIS — Z96642 Presence of left artificial hip joint: Secondary | ICD-10-CM | POA: Diagnosis not present

## 2020-11-28 DIAGNOSIS — Z1211 Encounter for screening for malignant neoplasm of colon: Secondary | ICD-10-CM | POA: Diagnosis not present

## 2021-01-15 DIAGNOSIS — K2289 Other specified disease of esophagus: Secondary | ICD-10-CM | POA: Diagnosis not present

## 2021-01-15 DIAGNOSIS — K2970 Gastritis, unspecified, without bleeding: Secondary | ICD-10-CM | POA: Diagnosis not present

## 2021-01-15 DIAGNOSIS — Z9889 Other specified postprocedural states: Secondary | ICD-10-CM | POA: Diagnosis not present

## 2021-01-15 DIAGNOSIS — K5730 Diverticulosis of large intestine without perforation or abscess without bleeding: Secondary | ICD-10-CM | POA: Diagnosis not present

## 2021-01-15 DIAGNOSIS — K640 First degree hemorrhoids: Secondary | ICD-10-CM | POA: Diagnosis not present

## 2021-01-15 DIAGNOSIS — Z1211 Encounter for screening for malignant neoplasm of colon: Secondary | ICD-10-CM | POA: Diagnosis not present

## 2021-01-15 DIAGNOSIS — K219 Gastro-esophageal reflux disease without esophagitis: Secondary | ICD-10-CM | POA: Diagnosis not present

## 2021-01-15 DIAGNOSIS — K3189 Other diseases of stomach and duodenum: Secondary | ICD-10-CM | POA: Diagnosis not present

## 2021-01-15 DIAGNOSIS — K208 Other esophagitis without bleeding: Secondary | ICD-10-CM | POA: Diagnosis not present

## 2021-02-13 DIAGNOSIS — M5416 Radiculopathy, lumbar region: Secondary | ICD-10-CM | POA: Diagnosis not present

## 2021-02-13 DIAGNOSIS — Z96642 Presence of left artificial hip joint: Secondary | ICD-10-CM | POA: Diagnosis not present

## 2021-03-01 DIAGNOSIS — R972 Elevated prostate specific antigen [PSA]: Secondary | ICD-10-CM | POA: Diagnosis not present

## 2021-03-01 DIAGNOSIS — I10 Essential (primary) hypertension: Secondary | ICD-10-CM | POA: Diagnosis not present

## 2021-03-19 DIAGNOSIS — Z0000 Encounter for general adult medical examination without abnormal findings: Secondary | ICD-10-CM | POA: Diagnosis not present

## 2021-03-19 DIAGNOSIS — M0579 Rheumatoid arthritis with rheumatoid factor of multiple sites without organ or systems involvement: Secondary | ICD-10-CM | POA: Diagnosis not present

## 2021-03-19 DIAGNOSIS — R350 Frequency of micturition: Secondary | ICD-10-CM | POA: Diagnosis not present

## 2021-03-19 DIAGNOSIS — Z1389 Encounter for screening for other disorder: Secondary | ICD-10-CM | POA: Diagnosis not present

## 2021-03-19 DIAGNOSIS — N401 Enlarged prostate with lower urinary tract symptoms: Secondary | ICD-10-CM | POA: Diagnosis not present

## 2021-03-19 DIAGNOSIS — I10 Essential (primary) hypertension: Secondary | ICD-10-CM | POA: Diagnosis not present

## 2021-03-19 DIAGNOSIS — Z125 Encounter for screening for malignant neoplasm of prostate: Secondary | ICD-10-CM | POA: Diagnosis not present

## 2021-08-31 DIAGNOSIS — M159 Polyosteoarthritis, unspecified: Secondary | ICD-10-CM | POA: Diagnosis not present

## 2021-08-31 DIAGNOSIS — Z79899 Other long term (current) drug therapy: Secondary | ICD-10-CM | POA: Diagnosis not present

## 2021-08-31 DIAGNOSIS — Z7960 Long term (current) use of unspecified immunomodulators and immunosuppressants: Secondary | ICD-10-CM | POA: Diagnosis not present

## 2021-08-31 DIAGNOSIS — M0579 Rheumatoid arthritis with rheumatoid factor of multiple sites without organ or systems involvement: Secondary | ICD-10-CM | POA: Diagnosis not present

## 2021-09-10 DIAGNOSIS — Z125 Encounter for screening for malignant neoplasm of prostate: Secondary | ICD-10-CM | POA: Diagnosis not present

## 2021-09-10 DIAGNOSIS — I10 Essential (primary) hypertension: Secondary | ICD-10-CM | POA: Diagnosis not present

## 2021-09-12 DIAGNOSIS — H5203 Hypermetropia, bilateral: Secondary | ICD-10-CM | POA: Diagnosis not present

## 2021-09-17 DIAGNOSIS — I10 Essential (primary) hypertension: Secondary | ICD-10-CM | POA: Diagnosis not present

## 2021-09-17 DIAGNOSIS — Z0001 Encounter for general adult medical examination with abnormal findings: Secondary | ICD-10-CM | POA: Diagnosis not present

## 2021-09-17 DIAGNOSIS — M0579 Rheumatoid arthritis with rheumatoid factor of multiple sites without organ or systems involvement: Secondary | ICD-10-CM | POA: Diagnosis not present

## 2021-09-17 DIAGNOSIS — R350 Frequency of micturition: Secondary | ICD-10-CM | POA: Diagnosis not present

## 2021-09-17 DIAGNOSIS — D508 Other iron deficiency anemias: Secondary | ICD-10-CM | POA: Diagnosis not present

## 2021-09-17 DIAGNOSIS — K219 Gastro-esophageal reflux disease without esophagitis: Secondary | ICD-10-CM | POA: Diagnosis not present

## 2021-09-17 DIAGNOSIS — N401 Enlarged prostate with lower urinary tract symptoms: Secondary | ICD-10-CM | POA: Diagnosis not present

## 2021-12-25 DIAGNOSIS — Z96642 Presence of left artificial hip joint: Secondary | ICD-10-CM | POA: Diagnosis not present

## 2022-01-07 DIAGNOSIS — Z7960 Long term (current) use of unspecified immunomodulators and immunosuppressants: Secondary | ICD-10-CM | POA: Diagnosis not present

## 2022-01-07 DIAGNOSIS — M0579 Rheumatoid arthritis with rheumatoid factor of multiple sites without organ or systems involvement: Secondary | ICD-10-CM | POA: Diagnosis not present

## 2022-01-07 DIAGNOSIS — M153 Secondary multiple arthritis: Secondary | ICD-10-CM | POA: Diagnosis not present

## 2022-01-07 DIAGNOSIS — Z79899 Other long term (current) drug therapy: Secondary | ICD-10-CM | POA: Diagnosis not present

## 2022-03-06 IMAGING — DX DG HIP (WITH OR WITHOUT PELVIS) 1V PORT*L*
2 series · 2 of 2 positions shown · non-contrast
Comparison: None.

CLINICAL DATA: Post left hip arthroplasty

EXAM:
DG HIP (WITH OR WITHOUT PELVIS) 1V PORT LEFT

[pelvis ap]
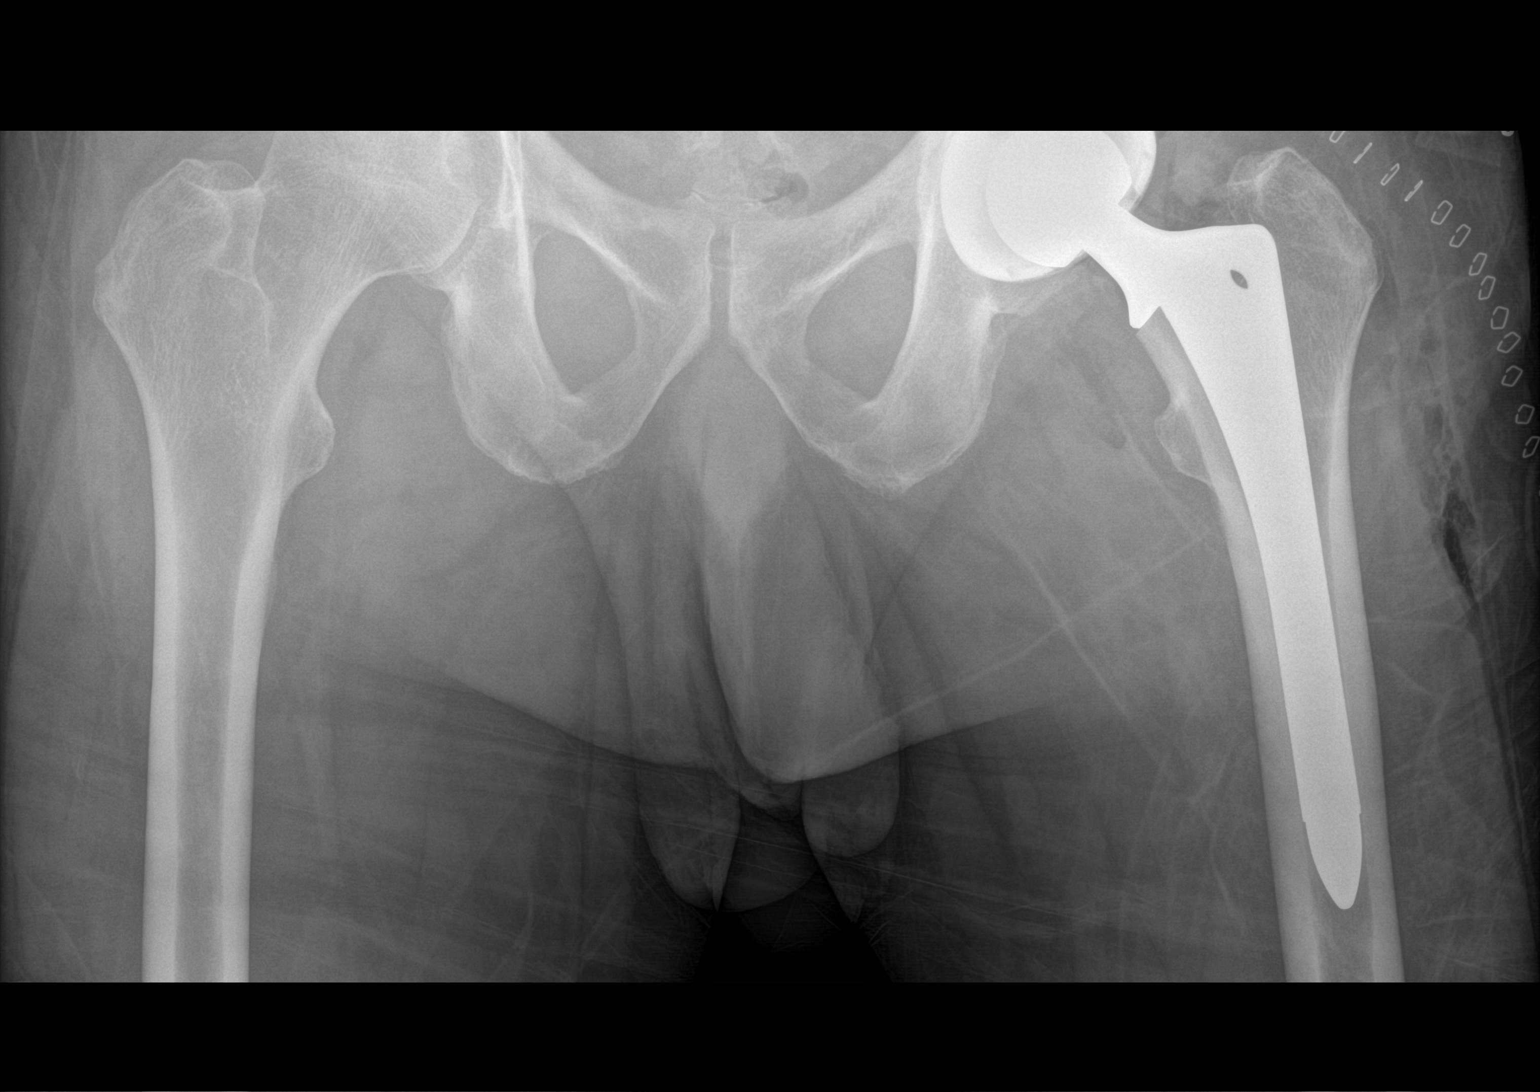

[hip lat]
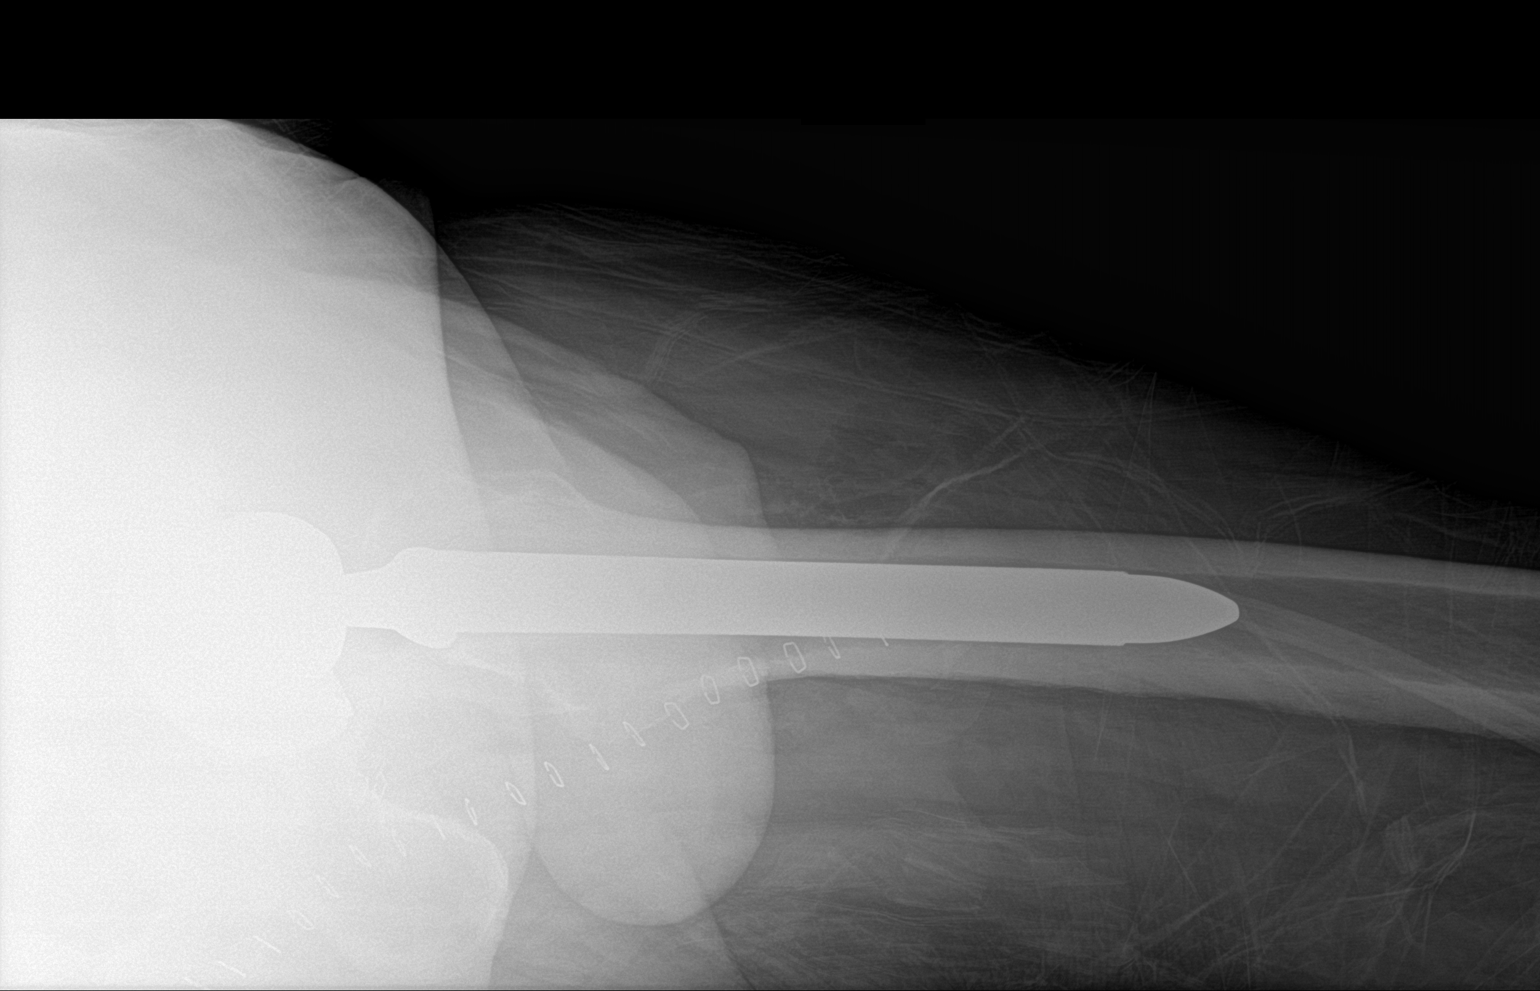

[2 of 2 positions shown; findings below may reference images not displayed]

FINDINGS: Changes of left hip replacement. Normal alignment. No hardware or
bony complicating feature.
IMPRESSION: Left hip replacement.  No visible complicating feature.

## 2022-03-13 DIAGNOSIS — I10 Essential (primary) hypertension: Secondary | ICD-10-CM | POA: Diagnosis not present

## 2022-03-25 DIAGNOSIS — M0579 Rheumatoid arthritis with rheumatoid factor of multiple sites without organ or systems involvement: Secondary | ICD-10-CM | POA: Diagnosis not present

## 2022-03-25 DIAGNOSIS — I10 Essential (primary) hypertension: Secondary | ICD-10-CM | POA: Diagnosis not present

## 2022-03-25 DIAGNOSIS — R350 Frequency of micturition: Secondary | ICD-10-CM | POA: Diagnosis not present

## 2022-03-25 DIAGNOSIS — Z0000 Encounter for general adult medical examination without abnormal findings: Secondary | ICD-10-CM | POA: Diagnosis not present

## 2022-03-25 DIAGNOSIS — K219 Gastro-esophageal reflux disease without esophagitis: Secondary | ICD-10-CM | POA: Diagnosis not present

## 2022-03-25 DIAGNOSIS — N401 Enlarged prostate with lower urinary tract symptoms: Secondary | ICD-10-CM | POA: Diagnosis not present

## 2022-03-25 DIAGNOSIS — Z125 Encounter for screening for malignant neoplasm of prostate: Secondary | ICD-10-CM | POA: Diagnosis not present

## 2022-03-25 DIAGNOSIS — D509 Iron deficiency anemia, unspecified: Secondary | ICD-10-CM | POA: Diagnosis not present

## 2022-03-25 DIAGNOSIS — Z1331 Encounter for screening for depression: Secondary | ICD-10-CM | POA: Diagnosis not present

## 2022-05-09 DIAGNOSIS — M0579 Rheumatoid arthritis with rheumatoid factor of multiple sites without organ or systems involvement: Secondary | ICD-10-CM | POA: Diagnosis not present

## 2022-05-09 DIAGNOSIS — M159 Polyosteoarthritis, unspecified: Secondary | ICD-10-CM | POA: Diagnosis not present

## 2022-05-09 DIAGNOSIS — Z7960 Long term (current) use of unspecified immunomodulators and immunosuppressants: Secondary | ICD-10-CM | POA: Diagnosis not present

## 2022-05-09 DIAGNOSIS — M818 Other osteoporosis without current pathological fracture: Secondary | ICD-10-CM | POA: Diagnosis not present

## 2022-05-17 DIAGNOSIS — M8588 Other specified disorders of bone density and structure, other site: Secondary | ICD-10-CM | POA: Diagnosis not present

## 2022-10-10 DIAGNOSIS — M0579 Rheumatoid arthritis with rheumatoid factor of multiple sites without organ or systems involvement: Secondary | ICD-10-CM | POA: Diagnosis not present

## 2022-10-10 DIAGNOSIS — M85851 Other specified disorders of bone density and structure, right thigh: Secondary | ICD-10-CM | POA: Insufficient documentation

## 2022-10-10 DIAGNOSIS — M150 Primary generalized (osteo)arthritis: Secondary | ICD-10-CM | POA: Insufficient documentation

## 2022-10-10 DIAGNOSIS — M159 Polyosteoarthritis, unspecified: Secondary | ICD-10-CM | POA: Diagnosis not present

## 2022-10-10 DIAGNOSIS — Z79899 Other long term (current) drug therapy: Secondary | ICD-10-CM | POA: Diagnosis not present

## 2022-10-23 IMAGING — MR MR SHOULDER*R* W/O CM
4 of 5 series · 31 of 40 positions shown · non-contrast
Comparison: None.

CLINICAL DATA: Right shoulder soreness with limited range of motion
for 1 month. No acute injury. Prior cortisone injection without
relief.

EXAM:
MRI OF THE RIGHT SHOULDER WITHOUT CONTRAST
TECHNIQUE: Multiplanar, multisequence MR imaging of the shoulder was performed.
No intravenous contrast was administered.

[Series 5: T2 fat-sat · axial · right · 4.0mm · 0.44mm/px · z∈[-67,+50]mm · 8 of 26 slices shown (1 of 3)]
[im 1/26]
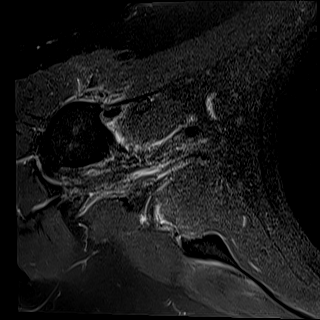
[im 4/26]
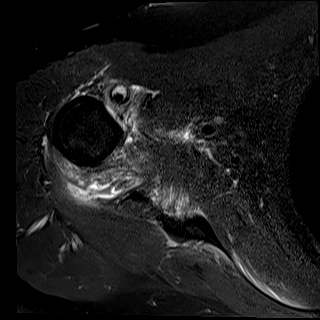
[im 8/26]
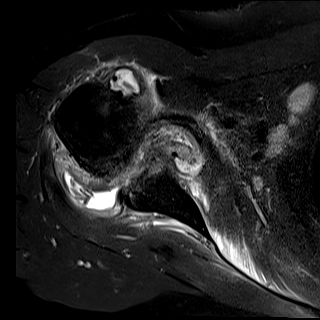
[im 11/26]
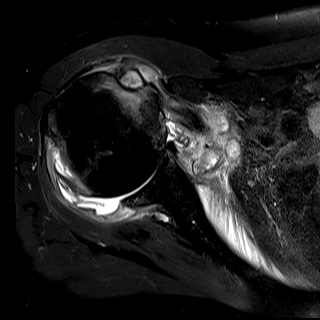
[im 15/26]
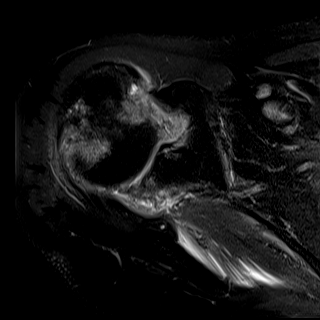
[im 18/26]
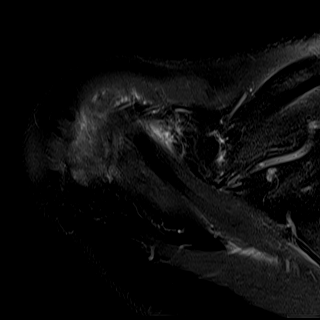
[im 22/26]
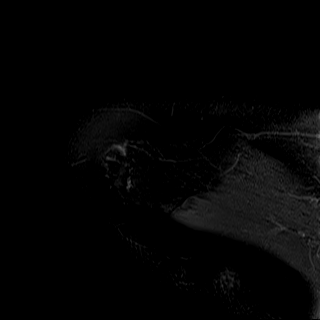
[im 26/26]
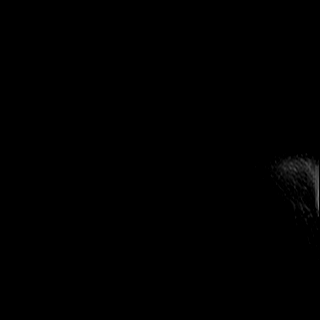

[Series 6: PD · oblique · right · 4.0mm · 0.44mm/px · 9 of 30 slices shown]
[im 1/30]
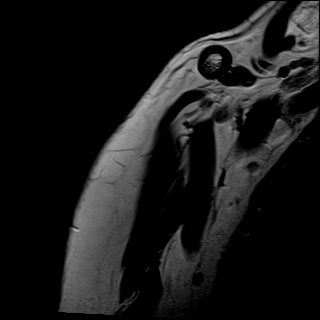
[im 4/30]
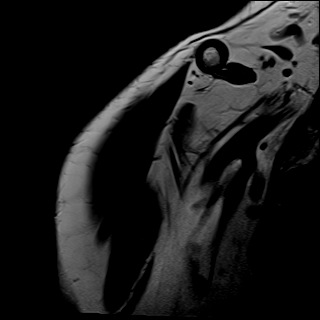
[im 8/30]
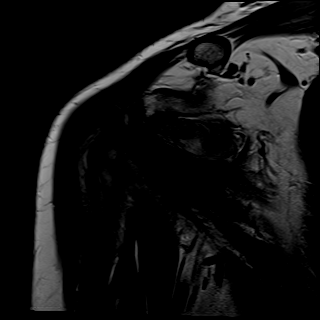
[im 11/30]
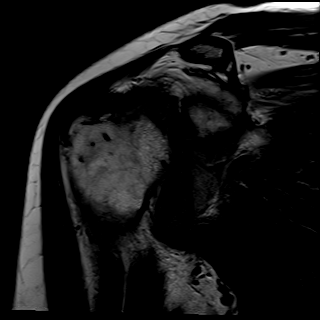
[im 15/30]
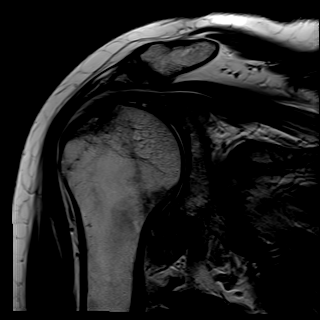
[im 19/30]
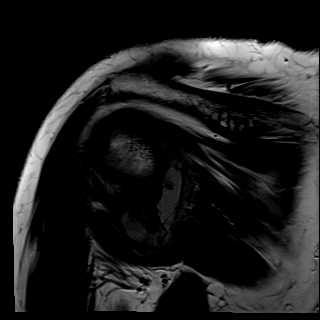
[im 22/30]
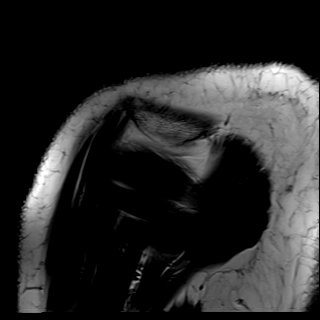
[im 26/30]
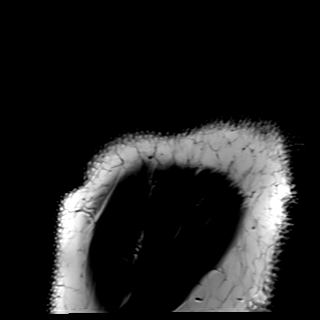
[im 30/30]
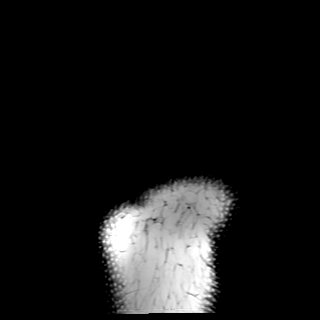

[Series 7: T2 fat-sat · oblique · right · 4.0mm · 0.44mm/px · 9 of 30 slices shown (2 of 3)]
[im 1/30]
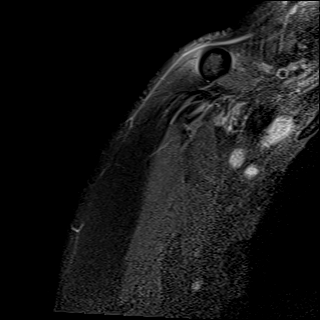
[im 4/30]
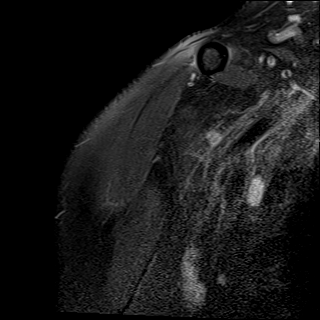
[im 8/30]
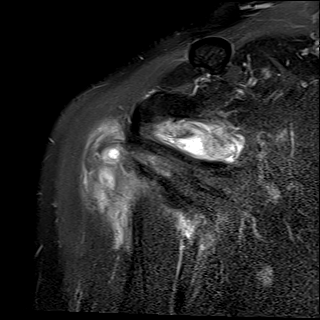
[im 11/30]
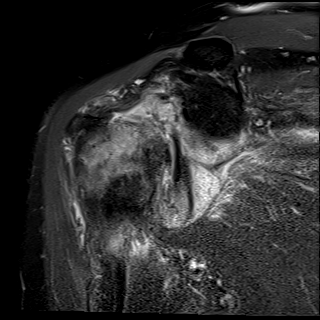
[im 15/30]
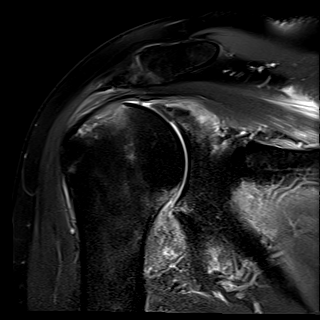
[im 19/30]
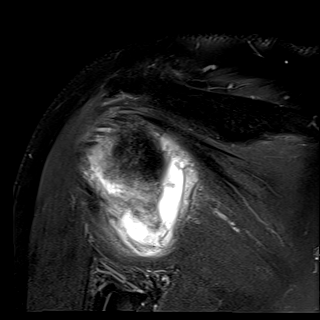
[im 22/30]
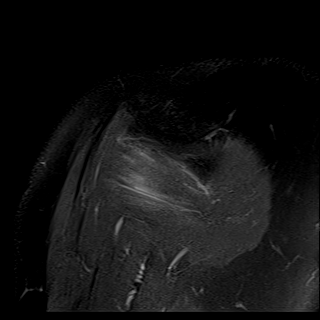
[im 26/30]
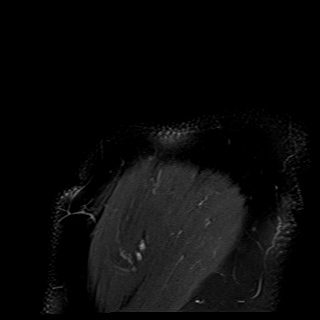
[im 30/30]
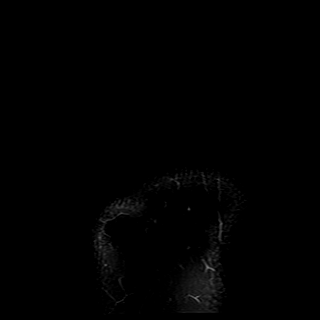

[Series 8: T2 fat-sat · oblique · right · 4.0mm · 0.22mm/px · 5 of 22 slices shown (3 of 3)]
[im 1/22]
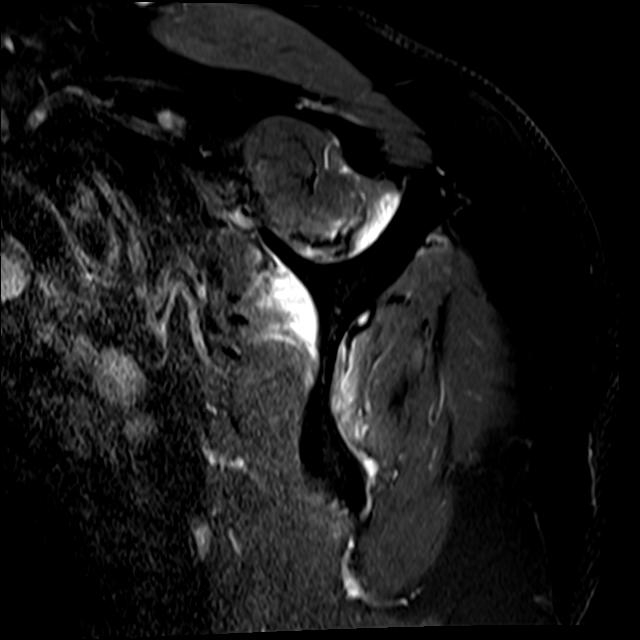
[im 4/22]
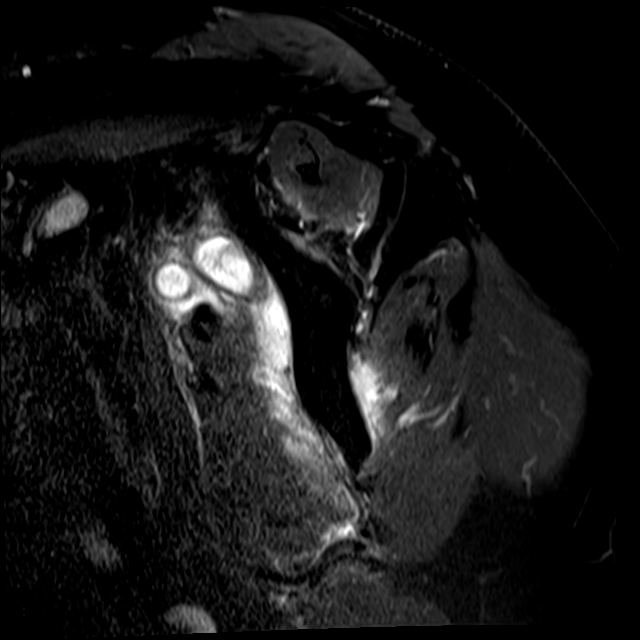
[im 8/22]
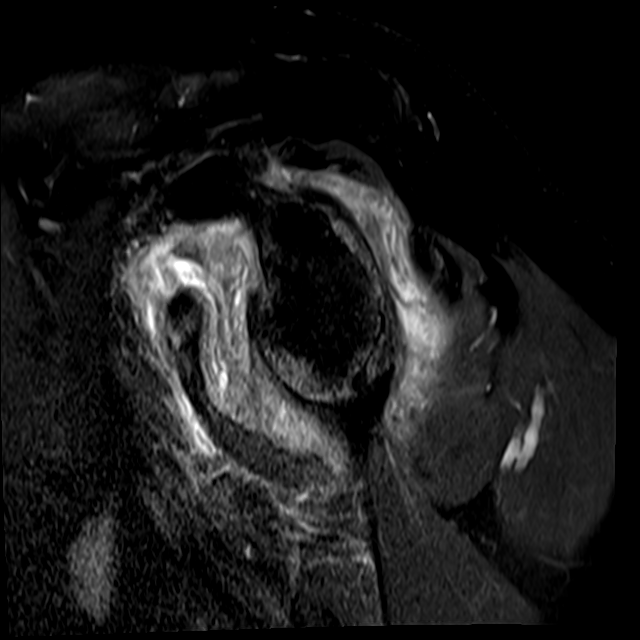
[im 11/22]
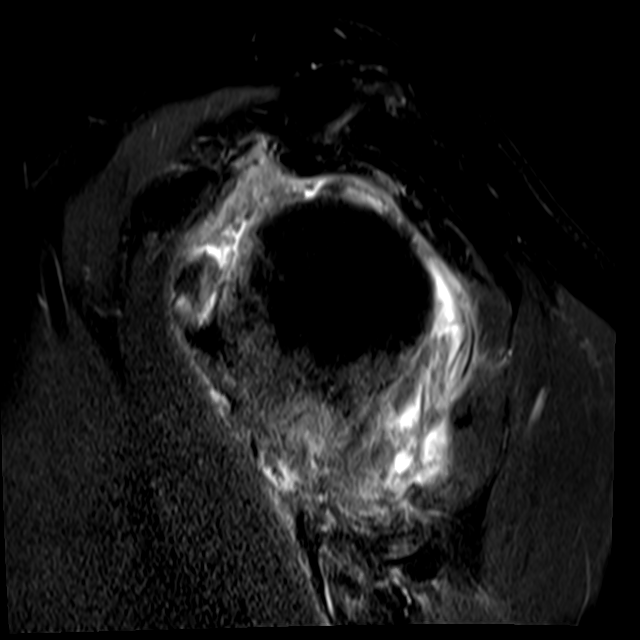
[im 18/22]
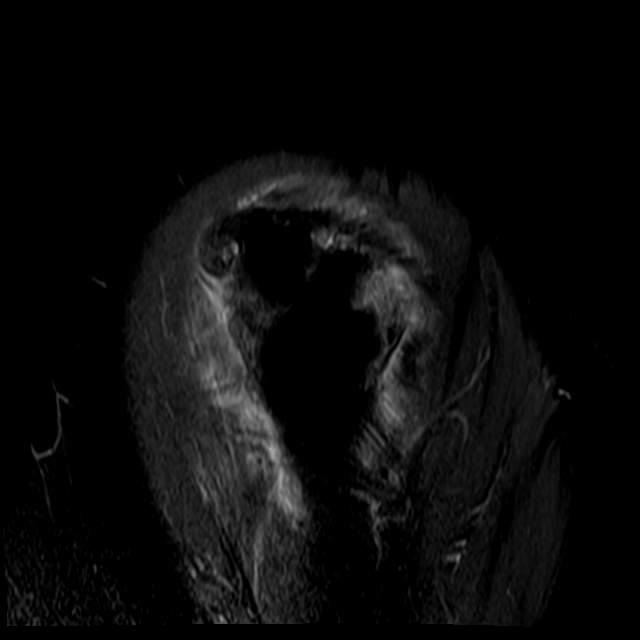

[31 of 40 positions shown; findings below may reference images not displayed]

FINDINGS: Rotator cuff: Moderate tendinosis and partial tearing of the
subscapularis tendon. The supraspinatus and infraspinatus tendons
appear intact with mild tendinosis. The teres minor tendon appears
normal.

Muscles: No focal rotator cuff muscular atrophy. There is
periarticular soft tissue edema involving the rotator cuff
musculature.

Biceps long head: Appears completely ruptured and distally retracted
in the bicipital groove. Complex fluid in the biceps tendon sheath.

Acromioclavicular Joint: The acromion is type [DATE]. There are mild
acromioclavicular degenerative changes. No significant fluid is
present in the subacromial - subdeltoid bursa.

Glenohumeral Joint: Complex shoulder joint effusion with diffuse
synovial irregularity consistent with synovitis. Moderate
glenohumeral arthropathy with subchondral edema inferiorly in the
glenoid. No discrete erosive changes or bone destruction.

Labrum: The labrum is diffusely degenerated with probable
degenerative tearing superiorly and posteriorly.

Bones: Subchondral edema superiorly in the humeral head without
acute osseous findings or discrete erosive changes.

Other: Generalized soft tissue edema surrounding the shoulder
without focal periarticular fluid collection. There are several
prominent right axillary lymph nodes which are likely reactive.
IMPRESSION: 1. Complex right shoulder joint effusion with periarticular soft
tissue edema suspicious for inflammatory arthropathy. Joint
infection should be excluded clinically.
2. Diffuse labral degeneration with probable degenerative tearing of
the labrum superiorly and posteriorly. The long head of the biceps
tendon appears ruptured.
3. Subscapularis tendinosis and partial tearing. Lesser tendinosis
of the supraspinatus and infraspinatus tendons.
4. Prominent right axillary lymph nodes, likely reactive.

## 2022-12-06 DIAGNOSIS — Z125 Encounter for screening for malignant neoplasm of prostate: Secondary | ICD-10-CM | POA: Diagnosis not present

## 2022-12-06 DIAGNOSIS — I10 Essential (primary) hypertension: Secondary | ICD-10-CM | POA: Diagnosis not present

## 2022-12-13 DIAGNOSIS — Z125 Encounter for screening for malignant neoplasm of prostate: Secondary | ICD-10-CM | POA: Diagnosis not present

## 2022-12-13 DIAGNOSIS — I129 Hypertensive chronic kidney disease with stage 1 through stage 4 chronic kidney disease, or unspecified chronic kidney disease: Secondary | ICD-10-CM | POA: Diagnosis not present

## 2022-12-13 DIAGNOSIS — Z87891 Personal history of nicotine dependence: Secondary | ICD-10-CM | POA: Diagnosis not present

## 2022-12-13 DIAGNOSIS — Z23 Encounter for immunization: Secondary | ICD-10-CM | POA: Diagnosis not present

## 2022-12-13 DIAGNOSIS — Z0001 Encounter for general adult medical examination with abnormal findings: Secondary | ICD-10-CM | POA: Diagnosis not present

## 2022-12-13 DIAGNOSIS — N1831 Chronic kidney disease, stage 3a: Secondary | ICD-10-CM | POA: Diagnosis not present

## 2022-12-13 DIAGNOSIS — K219 Gastro-esophageal reflux disease without esophagitis: Secondary | ICD-10-CM | POA: Diagnosis not present

## 2022-12-13 DIAGNOSIS — D508 Other iron deficiency anemias: Secondary | ICD-10-CM | POA: Diagnosis not present

## 2022-12-13 DIAGNOSIS — N401 Enlarged prostate with lower urinary tract symptoms: Secondary | ICD-10-CM | POA: Diagnosis not present

## 2022-12-19 DIAGNOSIS — Z96642 Presence of left artificial hip joint: Secondary | ICD-10-CM | POA: Diagnosis not present

## 2023-01-02 DIAGNOSIS — E785 Hyperlipidemia, unspecified: Secondary | ICD-10-CM | POA: Diagnosis not present

## 2023-01-02 DIAGNOSIS — D649 Anemia, unspecified: Secondary | ICD-10-CM | POA: Diagnosis not present

## 2023-01-02 DIAGNOSIS — I10 Essential (primary) hypertension: Secondary | ICD-10-CM | POA: Diagnosis not present

## 2023-01-02 DIAGNOSIS — R8290 Unspecified abnormal findings in urine: Secondary | ICD-10-CM | POA: Diagnosis not present

## 2023-01-02 DIAGNOSIS — N1831 Chronic kidney disease, stage 3a: Secondary | ICD-10-CM | POA: Diagnosis not present

## 2023-01-03 ENCOUNTER — Other Ambulatory Visit: Payer: Self-pay | Admitting: Nephrology

## 2023-01-03 DIAGNOSIS — D649 Anemia, unspecified: Secondary | ICD-10-CM

## 2023-01-03 DIAGNOSIS — R8290 Unspecified abnormal findings in urine: Secondary | ICD-10-CM

## 2023-01-03 DIAGNOSIS — N1831 Chronic kidney disease, stage 3a: Secondary | ICD-10-CM

## 2023-02-10 DIAGNOSIS — N1831 Chronic kidney disease, stage 3a: Secondary | ICD-10-CM | POA: Diagnosis not present

## 2023-02-10 DIAGNOSIS — D649 Anemia, unspecified: Secondary | ICD-10-CM | POA: Diagnosis not present

## 2023-02-10 DIAGNOSIS — E785 Hyperlipidemia, unspecified: Secondary | ICD-10-CM | POA: Diagnosis not present

## 2023-02-10 DIAGNOSIS — I10 Essential (primary) hypertension: Secondary | ICD-10-CM | POA: Diagnosis not present

## 2023-02-17 DIAGNOSIS — M150 Primary generalized (osteo)arthritis: Secondary | ICD-10-CM | POA: Diagnosis not present

## 2023-02-17 DIAGNOSIS — M0579 Rheumatoid arthritis with rheumatoid factor of multiple sites without organ or systems involvement: Secondary | ICD-10-CM | POA: Diagnosis not present

## 2023-02-17 DIAGNOSIS — Z7960 Long term (current) use of unspecified immunomodulators and immunosuppressants: Secondary | ICD-10-CM | POA: Diagnosis not present

## 2023-03-24 DIAGNOSIS — E785 Hyperlipidemia, unspecified: Secondary | ICD-10-CM | POA: Diagnosis not present

## 2023-03-24 DIAGNOSIS — I10 Essential (primary) hypertension: Secondary | ICD-10-CM | POA: Diagnosis not present

## 2023-03-24 DIAGNOSIS — D649 Anemia, unspecified: Secondary | ICD-10-CM | POA: Diagnosis not present

## 2023-03-24 DIAGNOSIS — N1831 Chronic kidney disease, stage 3a: Secondary | ICD-10-CM | POA: Diagnosis not present

## 2023-06-05 DIAGNOSIS — I10 Essential (primary) hypertension: Secondary | ICD-10-CM | POA: Diagnosis not present

## 2023-06-05 DIAGNOSIS — R972 Elevated prostate specific antigen [PSA]: Secondary | ICD-10-CM | POA: Diagnosis not present

## 2023-06-12 DIAGNOSIS — Z125 Encounter for screening for malignant neoplasm of prostate: Secondary | ICD-10-CM | POA: Diagnosis not present

## 2023-06-12 DIAGNOSIS — D508 Other iron deficiency anemias: Secondary | ICD-10-CM | POA: Diagnosis not present

## 2023-06-12 DIAGNOSIS — M0579 Rheumatoid arthritis with rheumatoid factor of multiple sites without organ or systems involvement: Secondary | ICD-10-CM | POA: Diagnosis not present

## 2023-06-12 DIAGNOSIS — Z1331 Encounter for screening for depression: Secondary | ICD-10-CM | POA: Diagnosis not present

## 2023-06-12 DIAGNOSIS — I129 Hypertensive chronic kidney disease with stage 1 through stage 4 chronic kidney disease, or unspecified chronic kidney disease: Secondary | ICD-10-CM | POA: Diagnosis not present

## 2023-06-12 DIAGNOSIS — Z0000 Encounter for general adult medical examination without abnormal findings: Secondary | ICD-10-CM | POA: Diagnosis not present

## 2023-06-12 DIAGNOSIS — N1831 Chronic kidney disease, stage 3a: Secondary | ICD-10-CM | POA: Diagnosis not present

## 2023-06-12 DIAGNOSIS — N401 Enlarged prostate with lower urinary tract symptoms: Secondary | ICD-10-CM | POA: Diagnosis not present

## 2023-06-12 DIAGNOSIS — Z87891 Personal history of nicotine dependence: Secondary | ICD-10-CM | POA: Diagnosis not present

## 2023-07-22 ENCOUNTER — Ambulatory Visit (INDEPENDENT_AMBULATORY_CARE_PROVIDER_SITE_OTHER): Admitting: Urology

## 2023-07-22 VITALS — BP 125/80 | HR 67 | Ht 69.0 in | Wt 204.5 lb

## 2023-07-22 DIAGNOSIS — N1831 Chronic kidney disease, stage 3a: Secondary | ICD-10-CM

## 2023-07-22 DIAGNOSIS — R972 Elevated prostate specific antigen [PSA]: Secondary | ICD-10-CM

## 2023-07-22 NOTE — Patient Instructions (Signed)
 We will check a blood test today called a PHI(prostate health index) score.  This can help us  determine if your PSA is elevated from an enlarged prostate or from prostate cancer.  If this number is reassuring, we will recheck the PSA in about 6 months, however if it is concerning for prostate cancer we will move forward with either a prostate MRI or prostate biopsy for further evaluation.  Prostate Cancer Screening  Prostate cancer screening is testing that is done to check for the presence of prostate cancer in men. The prostate gland is a walnut-sized gland that is located below the bladder and in front of the rectum in males. The function of the prostate is to add fluid to semen during ejaculation. Prostate cancer is one of the most common types of cancer in men. Who should have prostate cancer screening? Screening recommendations vary based on age and other risk factors, as well as between the professional organizations who make the recommendations. In general, screening is recommended if: You are age 52 to 78 and have an average risk for prostate cancer. You should talk with your health care provider about your need for screening and how often screening should be done. Because most prostate cancers are slow growing and will not cause death, screening in this age group is generally reserved for men who have a 10- to 15-year life expectancy. You are younger than age 57, and you have these risk factors: Having a father, brother, or uncle who has been diagnosed with prostate cancer. The risk is higher if your family member's cancer occurred at an early age or if you have multiple family members with prostate cancer at an early age. Being a male who is [place] or is of [place] or sub-Saharan African descent. In general, screening is not recommended if: You are younger than age 70. You are between the ages of 78 and 23 and you have no risk factors. You are 12 years of age or older. At this age, the  risks that screening can cause are greater than the benefits that it may provide. If you are at high risk for prostate cancer, your health care provider may recommend that you have screenings more often or that you start screening at a younger age. How is screening for prostate cancer done? The recommended prostate cancer screening test is a blood test called the prostate-specific antigen (PSA) test. PSA is a protein that is made in the prostate. As you age, your prostate naturally produces more PSA. Abnormally high PSA levels may be caused by: Prostate cancer. An enlarged prostate that is not caused by cancer (benign prostatic hyperplasia, or BPH). This condition is very common in older men. A prostate gland infection (prostatitis) or urinary tract infection. Certain medicines such as male hormones (like testosterone) or other medicines that raise testosterone levels. A rectal exam may be done as part of prostate cancer screening to help provide information about the size of your prostate gland. When a rectal exam is performed, it should be done after the PSA level is drawn to avoid any effect on the results. Depending on the PSA results, you may need more tests, such as: A physical exam to check the size of your prostate gland, if not done as part of screening. Blood and imaging tests. A procedure to remove tissue samples from your prostate gland for testing (biopsy). This is the only way to know for certain if you have prostate cancer. What are the benefits of prostate  cancer screening? Screening can help to identify cancer at an early stage, before symptoms start and when the cancer can be treated more easily. There is a small chance that screening may lower your risk of dying from prostate cancer. The chance is small because prostate cancer is a slow-growing cancer, and most men with prostate cancer die from a different cause. What are the risks of prostate cancer screening? The main risk of  prostate cancer screening is diagnosing and treating prostate cancer that would never have caused any symptoms or problems. This is called overdiagnosisand overtreatment. PSA screening cannot tell you if your PSA is high due to cancer or a different cause. A prostate biopsy is the only procedure to diagnose prostate cancer. Even the results of a biopsy may not tell you if your cancer needs to be treated. Slow-growing prostate cancer may not need any treatment other than monitoring, so diagnosing and treating it may cause unnecessary stress or other side effects. Questions to ask your health care provider When should I start prostate cancer screening? What is my risk for prostate cancer? How often do I need screening? What type of screening tests do I need? How do I get my test results? What do my results mean? Do I need treatment? Where to find more information The American Cancer Society: www.cancer.org American Urological Association: www.auanet.org Contact a health care provider if: You have difficulty urinating. You have pain when you urinate or ejaculate. You have blood in your urine or semen. You have pain in your back or in the area of your prostate. Summary Prostate cancer is a common type of cancer in men. The prostate gland is located below the bladder and in front of the rectum. This gland adds fluid to semen during ejaculation. Prostate cancer screening may identify cancer at an early stage, when the cancer can be treated more easily and is less likely to have spread to other areas of the body. The prostate-specific antigen (PSA) test is the recommended screening test for prostate cancer, but it has associated risks. Discuss the risks and benefits of prostate cancer screening with your health care provider. If you are age 79 or older, the risks that screening can cause are greater than the benefits that it may provide. This information is not intended to replace advice given to you  by your health care provider. Make sure you discuss any questions you have with your health care provider. Document Revised: 07/24/2020 Document Reviewed: 07/24/2020 Elsevier Patient Education  2024 [employer].

## 2023-07-22 NOTE — Progress Notes (Signed)
   07/22/23 8:17 AM   Erik Peck 27-Dec-1950 [id number]  CC: Elevated PSA, CKD IIIa  HPI: 73 year old male with arthritis on narcotics and CKD IIIa referred for an elevated PSA of 7.75.  This is increased from 6.4 in October 2024, 3.18 August 2021, 4.13 August 2020.  He has never seen urology previously.  No prior imaging to review.  He denies any urinary symptoms.  No prior abdominal surgeries.   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  Social History:  reports that he has quit smoking. He has quit using smokeless tobacco. He reports current alcohol  use. He reports that he does not use drugs.  Physical Exam: BP 125/80   Pulse 67   Ht 5\' 9"  (1.753 m)   Wt 204 lb 8 oz (92.8 kg)   BMI 30.20 kg/m    Constitutional:  Alert and oriented, No acute distress. Cardiovascular: No clubbing, cyanosis, or edema. Respiratory: Normal respiratory effort, no increased work of breathing. GI: Abdomen is soft, nontender, nondistended, no abdominal masses  Laboratory Data: PSA history reviewed, see HPI  Pertinent Imaging: No abdominal imaging to review  Assessment & Plan:   73 year old male with chronic pain from arthritis on narcotics and stage IIIa CKD referred for an elevated PSA of 7.75 which has increased over the last few years.  We reviewed the implications of an elevated PSA and the uncertainty surrounding it. In general, a man's PSA increases with age and is produced by both normal and cancerous prostate tissue.  We also discussed the guidelines that do not  recommend routine screening in men over age 31.  The differential diagnosis for elevated PSA includes BPH, prostate cancer, infection/prostatitis, recent intercourse/ejaculation, recent urethroscopic manipulation (foley placement/cystoscopy) or trauma. Management of an elevated PSA can include observation/surveillance, prostate MRI, or prostate biopsy and we discussed this in detail. Our goal is to detect clinically significant prostate cancers, and manage with either active surveillance, surgery, or radiation for localized disease. Risks of prostate biopsy include bleeding, infection (including life threatening sepsis), pain, and lower urinary symptoms. Hematuria, hematospermia, and blood in the stool are all common after biopsy and can persist up to 4 weeks.  PHI score today, if concerning move forward with prostate MRI(need to confirm artificial hip is compatible) or prostate biopsy  Jay Meth, MD 07/22/2023  [facility] [address], [address] [place], [place] [address] [phone]

## 2023-07-24 ENCOUNTER — Ambulatory Visit: Payer: Self-pay | Admitting: Urology

## 2023-07-24 LAB — REFLEX INFORMATION

## 2023-07-24 LAB — PROSTATE HEALTH INDEX: Prostate Specific Ag: 3.7 ng/mL (ref 0.0–3.9)

## 2023-08-06 DIAGNOSIS — M0579 Rheumatoid arthritis with rheumatoid factor of multiple sites without organ or systems involvement: Secondary | ICD-10-CM | POA: Diagnosis not present

## 2023-08-06 DIAGNOSIS — Z7960 Long term (current) use of unspecified immunomodulators and immunosuppressants: Secondary | ICD-10-CM | POA: Diagnosis not present

## 2023-08-06 DIAGNOSIS — M150 Primary generalized (osteo)arthritis: Secondary | ICD-10-CM | POA: Diagnosis not present

## 2023-09-16 DIAGNOSIS — R8290 Unspecified abnormal findings in urine: Secondary | ICD-10-CM | POA: Diagnosis not present

## 2023-09-16 DIAGNOSIS — D649 Anemia, unspecified: Secondary | ICD-10-CM | POA: Diagnosis not present

## 2023-09-16 DIAGNOSIS — E785 Hyperlipidemia, unspecified: Secondary | ICD-10-CM | POA: Diagnosis not present

## 2023-09-16 DIAGNOSIS — N1831 Chronic kidney disease, stage 3a: Secondary | ICD-10-CM | POA: Diagnosis not present

## 2023-09-16 DIAGNOSIS — I10 Essential (primary) hypertension: Secondary | ICD-10-CM | POA: Diagnosis not present

## 2023-09-23 DIAGNOSIS — D649 Anemia, unspecified: Secondary | ICD-10-CM | POA: Diagnosis not present

## 2023-09-23 DIAGNOSIS — N1831 Chronic kidney disease, stage 3a: Secondary | ICD-10-CM | POA: Diagnosis not present

## 2023-09-23 DIAGNOSIS — I10 Essential (primary) hypertension: Secondary | ICD-10-CM | POA: Diagnosis not present

## 2023-09-23 DIAGNOSIS — E785 Hyperlipidemia, unspecified: Secondary | ICD-10-CM | POA: Diagnosis not present

## 2023-10-30 DIAGNOSIS — N1831 Chronic kidney disease, stage 3a: Secondary | ICD-10-CM | POA: Diagnosis not present

## 2023-10-30 DIAGNOSIS — D649 Anemia, unspecified: Secondary | ICD-10-CM | POA: Diagnosis not present

## 2023-10-30 DIAGNOSIS — E785 Hyperlipidemia, unspecified: Secondary | ICD-10-CM | POA: Diagnosis not present

## 2023-10-30 DIAGNOSIS — I10 Essential (primary) hypertension: Secondary | ICD-10-CM | POA: Diagnosis not present

## 2023-12-09 DIAGNOSIS — I10 Essential (primary) hypertension: Secondary | ICD-10-CM | POA: Diagnosis not present

## 2023-12-09 DIAGNOSIS — M0579 Rheumatoid arthritis with rheumatoid factor of multiple sites without organ or systems involvement: Secondary | ICD-10-CM | POA: Diagnosis not present

## 2023-12-09 DIAGNOSIS — M47812 Spondylosis without myelopathy or radiculopathy, cervical region: Secondary | ICD-10-CM | POA: Diagnosis not present

## 2023-12-09 DIAGNOSIS — M150 Primary generalized (osteo)arthritis: Secondary | ICD-10-CM | POA: Diagnosis not present

## 2023-12-09 DIAGNOSIS — Z7960 Long term (current) use of unspecified immunomodulators and immunosuppressants: Secondary | ICD-10-CM | POA: Diagnosis not present

## 2023-12-09 DIAGNOSIS — M85851 Other specified disorders of bone density and structure, right thigh: Secondary | ICD-10-CM | POA: Diagnosis not present

## 2023-12-09 DIAGNOSIS — M069 Rheumatoid arthritis, unspecified: Secondary | ICD-10-CM | POA: Diagnosis not present

## 2023-12-09 DIAGNOSIS — Z125 Encounter for screening for malignant neoplasm of prostate: Secondary | ICD-10-CM | POA: Diagnosis not present

## 2023-12-17 DIAGNOSIS — Z1331 Encounter for screening for depression: Secondary | ICD-10-CM | POA: Diagnosis not present

## 2023-12-17 DIAGNOSIS — D508 Other iron deficiency anemias: Secondary | ICD-10-CM | POA: Diagnosis not present

## 2023-12-17 DIAGNOSIS — I129 Hypertensive chronic kidney disease with stage 1 through stage 4 chronic kidney disease, or unspecified chronic kidney disease: Secondary | ICD-10-CM | POA: Diagnosis not present

## 2023-12-17 DIAGNOSIS — N401 Enlarged prostate with lower urinary tract symptoms: Secondary | ICD-10-CM | POA: Diagnosis not present

## 2023-12-17 DIAGNOSIS — M0579 Rheumatoid arthritis with rheumatoid factor of multiple sites without organ or systems involvement: Secondary | ICD-10-CM | POA: Diagnosis not present

## 2023-12-17 DIAGNOSIS — Z87891 Personal history of nicotine dependence: Secondary | ICD-10-CM | POA: Diagnosis not present

## 2023-12-17 DIAGNOSIS — Z0001 Encounter for general adult medical examination with abnormal findings: Secondary | ICD-10-CM | POA: Diagnosis not present

## 2023-12-17 DIAGNOSIS — Z125 Encounter for screening for malignant neoplasm of prostate: Secondary | ICD-10-CM | POA: Diagnosis not present

## 2023-12-17 DIAGNOSIS — N1831 Chronic kidney disease, stage 3a: Secondary | ICD-10-CM | POA: Diagnosis not present

## 2023-12-23 DIAGNOSIS — Z96642 Presence of left artificial hip joint: Secondary | ICD-10-CM | POA: Diagnosis not present

## 2023-12-29 ENCOUNTER — Encounter: Payer: Self-pay | Admitting: Urology

## 2024-01-13 DIAGNOSIS — H5203 Hypermetropia, bilateral: Secondary | ICD-10-CM | POA: Diagnosis not present

## 2024-01-20 ENCOUNTER — Ambulatory Visit: Admitting: Urology

## 2024-01-20 VITALS — BP 162/90 | HR 81 | Ht 68.0 in | Wt 200.0 lb

## 2024-01-20 DIAGNOSIS — R972 Elevated prostate specific antigen [PSA]: Secondary | ICD-10-CM | POA: Diagnosis not present

## 2024-01-20 NOTE — Patient Instructions (Signed)

## 2024-01-20 NOTE — Progress Notes (Signed)
   01/20/2024 3:39 PM   Erik Peck [birth year] [id number]  Reason for visit: Follow up elevated PSA  History: 73 year old male with arthritis on chronic narcotics and CKD IIIa Initial visit June 2025 for rising PSA value of 7.75 which had increased from 6.4 in October 2024, 3.8 in July 2023, 4.13 August 2020 He opted for a repeat PHI score in June 2025 which was normal at 3.7  Physical Exam: BP (!) 162/90 (BP Location: Right Arm, Patient Position: Sitting, Cuff Size: Large)   Pulse 81   Ht 5' 8 (1.727 m)   Wt 200 lb (90.7 kg)   SpO2 98%   BMI 30.41 kg/m   Imaging/labs: See HPI for PSA history  Today: PSA was checked by PCP October 2025 and had increased to 11.4, urinalysis was benign at that time Here with his wife today, understandably has concerns about PSA trend He denies any ejaculations within the last 3 days  Plan:   Elevated PSA: PSA was normal at 3.7 in June 2025, increased to 11.4, does have some prior elevated values.  Risks and benefits of screening reviewed and discussed options including a repeat PHI score, prostate MRI(has an artificial hip and will need to confirm compatibility) or prostate biopsy.  Ultimately using shared decision making he opted for a repeat PHI score today, and if remains elevated is interested in prostate MRI if hip is compatible PHI score today, call with results   Erik JAYSON Burnet, MD  [facility] [address] [place], [place] [address] [phone]

## 2024-01-22 ENCOUNTER — Ambulatory Visit: Payer: Self-pay | Admitting: Urology

## 2024-01-22 DIAGNOSIS — R972 Elevated prostate specific antigen [PSA]: Secondary | ICD-10-CM

## 2024-01-22 LAB — REFLEX INFORMATION

## 2024-01-22 LAB — PROSTATE HEALTH INDEX: Prostate Specific Ag: 12.7 ng/mL — ABNORMAL HIGH (ref 0.0–3.9)

## 2024-01-22 NOTE — Telephone Encounter (Signed)
 Prostate MRI ordered. Mychart message sent with instructions.

## 2024-01-28 ENCOUNTER — Ambulatory Visit: Admitting: Urology
